# Patient Record
Sex: Female | Born: 1990 | Race: Black or African American | Hispanic: No | Marital: Single | Smoking: Former smoker
Health system: Southern US, Community
[De-identification: ages and names within clinical notes are randomized; demographics above are authoritative.]

## PROBLEM LIST (undated history)

## (undated) DIAGNOSIS — M419 Scoliosis, unspecified: Secondary | ICD-10-CM

## (undated) DIAGNOSIS — D649 Anemia, unspecified: Secondary | ICD-10-CM

## (undated) DIAGNOSIS — J45909 Unspecified asthma, uncomplicated: Secondary | ICD-10-CM

## (undated) HISTORY — DX: Anemia, unspecified: D64.9

---

## 2017-08-21 ENCOUNTER — Emergency Department (HOSPITAL_COMMUNITY)
Admission: EM | Admit: 2017-08-21 | Discharge: 2017-08-21 | Disposition: A | Discharge status: Home / Self Care | Payer: 59 | Attending: Emergency Medicine | Admitting: Emergency Medicine

## 2017-08-21 ENCOUNTER — Encounter (HOSPITAL_COMMUNITY): Payer: Self-pay | Admitting: *Deleted

## 2017-08-21 ENCOUNTER — Other Ambulatory Visit: Payer: Self-pay

## 2017-08-21 DIAGNOSIS — J45909 Unspecified asthma, uncomplicated: Secondary | ICD-10-CM | POA: Insufficient documentation

## 2017-08-21 DIAGNOSIS — Y999 Unspecified external cause status: Secondary | ICD-10-CM | POA: Diagnosis not present

## 2017-08-21 DIAGNOSIS — Y92410 Unspecified street and highway as the place of occurrence of the external cause: Secondary | ICD-10-CM | POA: Insufficient documentation

## 2017-08-21 DIAGNOSIS — Y939 Activity, unspecified: Secondary | ICD-10-CM | POA: Diagnosis not present

## 2017-08-21 DIAGNOSIS — M545 Low back pain: Secondary | ICD-10-CM | POA: Insufficient documentation

## 2017-08-21 DIAGNOSIS — Z87891 Personal history of nicotine dependence: Secondary | ICD-10-CM | POA: Insufficient documentation

## 2017-08-21 DIAGNOSIS — M542 Cervicalgia: Secondary | ICD-10-CM | POA: Insufficient documentation

## 2017-08-21 HISTORY — DX: Unspecified asthma, uncomplicated: J45.909

## 2017-08-21 HISTORY — DX: Scoliosis, unspecified: M41.9

## 2017-08-21 MED ORDER — CYCLOBENZAPRINE HCL 10 MG PO TABS: 10.0000 mg | ORAL_TABLET | Freq: Two times a day (BID) | ORAL | 0 refills | Status: DC | PRN

## 2017-08-21 MED ORDER — IBUPROFEN 600 MG PO TABS: 600.0000 mg | ORAL_TABLET | Freq: Four times a day (QID) | ORAL | 0 refills | Status: DC | PRN
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
## 2018-06-14 DIAGNOSIS — G43001 Migraine without aura, not intractable, with status migrainosus: Secondary | ICD-10-CM | POA: Diagnosis not present

## 2018-09-30 DIAGNOSIS — J111 Influenza due to unidentified influenza virus with other respiratory manifestations: Secondary | ICD-10-CM | POA: Diagnosis not present

## 2019-02-04 DIAGNOSIS — R238 Other skin changes: Secondary | ICD-10-CM | POA: Diagnosis not present

## 2019-02-04 DIAGNOSIS — H6001 Abscess of right external ear: Secondary | ICD-10-CM | POA: Diagnosis not present

## 2019-07-09 ENCOUNTER — Encounter: Payer: Self-pay | Admitting: Emergency Medicine

## 2019-07-09 ENCOUNTER — Ambulatory Visit
Admission: EM | Admit: 2019-07-09 | Discharge: 2019-07-09 | Disposition: A | Discharge status: Home / Self Care | Payer: Medicaid Other | Attending: Physician Assistant | Admitting: Physician Assistant

## 2019-07-09 ENCOUNTER — Other Ambulatory Visit: Payer: Self-pay

## 2019-07-09 DIAGNOSIS — Z3201 Encounter for pregnancy test, result positive: Secondary | ICD-10-CM | POA: Diagnosis not present

## 2019-07-09 DIAGNOSIS — R0981 Nasal congestion: Secondary | ICD-10-CM | POA: Diagnosis not present

## 2019-07-09 DIAGNOSIS — R05 Cough: Secondary | ICD-10-CM | POA: Diagnosis not present

## 2019-07-09 DIAGNOSIS — O219 Vomiting of pregnancy, unspecified: Secondary | ICD-10-CM

## 2019-07-09 DIAGNOSIS — Z1159 Encounter for screening for other viral diseases: Secondary | ICD-10-CM

## 2019-07-09 DIAGNOSIS — R059 Cough, unspecified: Secondary | ICD-10-CM

## 2019-07-09 LAB — POCT URINE PREGNANCY: Preg Test, Ur: POSITIVE — AB

## 2019-07-09 MED ORDER — DOXYLAMINE-PYRIDOXINE 10-10 MG PO TBEC: 1.0000 | DELAYED_RELEASE_TABLET | Freq: Two times a day (BID) | ORAL | 0 refills | Status: DC | PRN

## 2019-07-09 MED ORDER — COMPLETENATE 29-1 MG PO CHEW: 1.0000 | CHEWABLE_TABLET | Freq: Every day | ORAL | 0 refills | Status: DC

## 2019-07-09 NOTE — ED Triage Notes (Signed)
PT reports cough, headache, nausea, and vomiting for 1 week. PT also reports she is 9 days late for menstrual and needs a pregnancy test.

## 2019-07-09 NOTE — ED Provider Notes (Signed)
[facility]    CSN: [id number] Arrival date & time: 07/09/19  1207      History   Chief Complaint Chief Complaint  Patient presents with  . Nausea  . Headache    HPI Loretta Schmitt is a 28 y.o. female.   28 year old female comes in for 1 week history of nausea, vomiting, cough, nasal congestion. Denies fever, chills, body aches. Denies abdominal pain, diarrhea. Denies shortness of breath, loss of taste/smell. No obvious sick/COVID contact.   She was like pregnancy test as she is 9 days late for her cycle. Had spotting 1 week ago that has since resolved.      Past Medical History:  Diagnosis Date  . Asthma   . Scoliosis     There are no active problems to display for this patient.   Past Surgical History:  Procedure Laterality Date  . CESAREAN SECTION      OB History   No obstetric history on file.      Home Medications    Prior to Admission medications   Medication Sig Start Date End Date Taking? Authorizing Provider  Doxylamine-Pyridoxine 10-10 MG TBEC Take 1 tablet by mouth 2 (two) times daily as needed. 07/09/19   Tasia Catchings,  V, PA-C  prenatal vitamin w/FE, FA (NATACHEW) 29-1 MG CHEW chewable tablet Chew 1 tablet by mouth daily at 12 noon. 07/09/19   Ok Edwards, PA-C    Family History No family history on file.  Social History Social History   Tobacco Use  . Smoking status: Former Smoker    Quit date: 10/06/2012    Years since quitting: 6.7  . Smokeless tobacco: Never Used  Substance Use Topics  . Alcohol use: No    Frequency: Never  . Drug use: No     Allergies   Patient has no known allergies.   Review of Systems Review of Systems  Reason unable to perform ROS: See HPI as above.     Physical Exam Triage Vital Signs ED Triage Vitals  Enc Vitals Group     BP 07/09/19 1225 114/76     Pulse Rate 07/09/19 1225 83     Resp 07/09/19 1225 16     Temp 07/09/19 1225 98.3 F (36.8 C)     Temp Source 07/09/19 1225 Oral     SpO2  07/09/19 1225 98 %     Weight --      Height --      Head Circumference --      Peak Flow --      Pain Score 07/09/19 1221 2     Pain Loc --      Pain Edu? --      Excl. in [place]? --    No data found.  Updated Vital Signs BP 114/76   Pulse 83   Temp 98.3 F (36.8 C) (Oral)   Resp 16   LMP 06/01/2019   SpO2 98%   Physical Exam Constitutional:      General: She is not in acute distress.    Appearance: Normal appearance. She is not ill-appearing, toxic-appearing or diaphoretic.  HENT:     Head: Normocephalic and atraumatic.     Nose:     Right Sinus: No maxillary sinus tenderness or frontal sinus tenderness.     Left Sinus: No maxillary sinus tenderness or frontal sinus tenderness.     Mouth/Throat:     Mouth: Mucous membranes are moist.     Pharynx:  Oropharynx is clear. Uvula midline.  Neck:     Musculoskeletal: Normal range of motion and neck supple.  Cardiovascular:     Rate and Rhythm: Normal rate and regular rhythm.     Heart sounds: Normal heart sounds. No murmur. No friction rub. No gallop.   Pulmonary:     Effort: Pulmonary effort is normal. No accessory muscle usage, prolonged expiration, respiratory distress or retractions.     Comments: Lungs clear to auscultation without adventitious lung sounds. Neurological:     General: No focal deficit present.     Mental Status: She is alert and oriented to person, place, and time.      UC Treatments / Results  Labs (all labs ordered are listed, but only abnormal results are displayed) Labs Reviewed  POCT URINE PREGNANCY - Abnormal; Notable for the following components:      Result Value   Preg Test, Ur Positive (*)    All other components within normal limits  NOVEL CORONAVIRUS, NAA    EKG   Radiology No results found.  Procedures Procedures (including critical care time)  Medications Ordered in UC Medications - No data to display  Initial Impression / Assessment and Plan / UC Course  I have reviewed  the triage vital signs and the nursing notes.  Pertinent labs & imaging results that were available during my care of the patient were reviewed by me and considered in my medical decision making (see chart for details).     Urine pregnancy positive. Patient without abdominal pain, vaginal bleeding. Given congestion and cough, will send for COVID testing. Will provide diclegis for nausea/vomiting and start prenatal vitamins. Follow up with OB for further evaluation and management needed. Return precautions given. Patient expresses understanding and agrees to plan.  Final Clinical Impressions(s) / UC Diagnoses   Final diagnoses:  Positive urine pregnancy test  Nausea/vomiting in pregnancy  Nasal congestion  Cough    ED Prescriptions    Medication Sig Dispense Auth. Provider   prenatal vitamin w/FE, FA (NATACHEW) 29-1 MG CHEW chewable tablet Chew 1 tablet by mouth daily at 12 noon. 30 tablet ,  V, PA-C   Doxylamine-Pyridoxine 10-10 MG TBEC Take 1 tablet by mouth 2 (two) times daily as needed. 30 tablet Belinda Fisher, PA-C     PDMP not reviewed this encounter.   Belinda Fisher, PA-C 07/09/19 1310

## 2019-07-09 NOTE — Discharge Instructions (Signed)
Urine pregnancy positive. Start prenatal vitamins. Start diclegis for nausea/vomiting. Can switch to over the counter version if diclegis is not covered under insurance. Follow up with OB for further evaluation and management needed. Given you have some cold symptoms, will test for COVID as well. Please quarantine until results return. If noticing shortness of breath, chest pain, vaginal bleeding, abdominal pain, go to the ED for further evaluation needed.

## 2019-07-10 LAB — NOVEL CORONAVIRUS, NAA: SARS-CoV-2, NAA: NOT DETECTED

## 2019-07-25 ENCOUNTER — Inpatient Hospital Stay (HOSPITAL_COMMUNITY)
Admission: AD | Admit: 2019-07-25 | Discharge: 2019-07-25 | Disposition: A | Discharge status: Home / Self Care | Payer: Managed Care, Other (non HMO) | Attending: Family Medicine | Admitting: Family Medicine

## 2019-07-25 ENCOUNTER — Encounter (HOSPITAL_COMMUNITY): Payer: Self-pay | Admitting: *Deleted

## 2019-07-25 ENCOUNTER — Other Ambulatory Visit: Payer: Self-pay

## 2019-07-25 DIAGNOSIS — Z3A01 Less than 8 weeks gestation of pregnancy: Secondary | ICD-10-CM | POA: Insufficient documentation

## 2019-07-25 DIAGNOSIS — O210 Mild hyperemesis gravidarum: Secondary | ICD-10-CM | POA: Diagnosis not present

## 2019-07-25 DIAGNOSIS — Z87891 Personal history of nicotine dependence: Secondary | ICD-10-CM | POA: Insufficient documentation

## 2019-07-25 DIAGNOSIS — O99511 Diseases of the respiratory system complicating pregnancy, first trimester: Secondary | ICD-10-CM | POA: Diagnosis not present

## 2019-07-25 DIAGNOSIS — J45909 Unspecified asthma, uncomplicated: Secondary | ICD-10-CM | POA: Diagnosis not present

## 2019-07-25 DIAGNOSIS — M419 Scoliosis, unspecified: Secondary | ICD-10-CM | POA: Insufficient documentation

## 2019-07-25 LAB — COMPREHENSIVE METABOLIC PANEL
ALT: 19 U/L (ref 0–44)
AST: 16 U/L (ref 15–41)
Albumin: 3.9 g/dL (ref 3.5–5.0)
Alkaline Phosphatase: 29 U/L — ABNORMAL LOW (ref 38–126)
Anion gap: 11 (ref 5–15)
BUN: 5 mg/dL — ABNORMAL LOW (ref 6–20)
CO2: 24 mmol/L (ref 22–32)
Calcium: 9.1 mg/dL (ref 8.9–10.3)
Chloride: 100 mmol/L (ref 98–111)
Creatinine, Ser: 0.52 mg/dL (ref 0.44–1.00)
GFR calc Af Amer: >60 mL/min (ref >60)
GFR calc non Af Amer: >60 mL/min (ref >60)
Glucose, Bld: 87 mg/dL (ref 70–99)
Potassium: 3.4 mmol/L — ABNORMAL LOW (ref 3.5–5.1)
Sodium: 135 mmol/L (ref 135–145)
Total Bilirubin: 0.9 mg/dL (ref 0.3–1.2)
Total Protein: 6.8 g/dL (ref 6.5–8.1)

## 2019-07-25 LAB — URINALYSIS, ROUTINE W REFLEX MICROSCOPIC
Bacteria, UA: NONE SEEN
Bilirubin Urine: NEGATIVE
Glucose, UA: NEGATIVE mg/dL
Hgb urine dipstick: NEGATIVE
Ketones, ur: 20 mg/dL — AB
Leukocytes,Ua: NEGATIVE
Nitrite: NEGATIVE
Protein, ur: >=300 mg/dL — AB
Specific Gravity, Urine: 1.028 (ref 1.005–1.030)
pH: 6 (ref 5.0–8.0)

## 2019-07-25 MED ORDER — M.V.I. ADULT IV INJ
Freq: Once | INTRAVENOUS | Status: AC
  Administered 2019-07-25: 18:00:00 via INTRAVENOUS
  Filled 2019-07-25: qty 1000

## 2019-07-25 MED ORDER — SCOPOLAMINE 1 MG/3DAYS TD PT72
1.0000 | MEDICATED_PATCH | Freq: Once | TRANSDERMAL | Status: DC
  Administered 2019-07-25: 1.5 mg via TRANSDERMAL
  Filled 2019-07-25: qty 1

## 2019-07-25 MED ORDER — SODIUM CHLORIDE 0.9 % IV SOLN
25.0000 mg | Freq: Once | INTRAVENOUS | Status: AC
  Administered 2019-07-25: 25 mg via INTRAVENOUS
  Filled 2019-07-25: qty 1

## 2019-07-25 NOTE — H&P (Signed)
Chief Complaint: Emesis   First Provider Initiated Contact with Patient 07/25/19 1557     SUBJECTIVE HPI: Loretta FurbishSharonda Schmitt is a 28 y.o. G2P0101 at 2213w5d who presents to Maternity Admissions reporting 2 weeks of progressively worsening vomiting. The pt states she can no longer hold down fluids including water. Patient has been taking doxylamine-pyridoxine for nausea and vomiting, however, she does not take this during the day because it is too sedating and she can't function in her employment. The patient reports that she is taking the medication at night as it helps her sleep but wakes up each morning around 5 or 6AM to immediately vomit. She also reports heavy sweats during many but not all vomiting episodes Patient denies pre/syncope, H/A, dysphagia, aspiration, no blood in vomit, urine or stool. Patient denies diarrhea/constipation. She is taking her prenatal vitamin without issues. Patient has visit scheduled with [facility] this week for first OB appointment.      Vomitting about ten times a day...given diclegis but makes her tired so cant take during the day, so only at night...helps her sleep She wakes at 5-6 AM to vomit Gets very sweaty while vomiting  Hx anemia  Past Medical History:  Diagnosis Date  . Asthma   . Scoliosis    OB History  Gravida Para Term Preterm AB Living  2 1   1   1   SAB TAB Ectopic Multiple Live Births               # Outcome Date GA Lbr Len/2nd Weight Sex Delivery Anes PTL Lv  2 Current           1 Preterm      CS-Unspec      Past Surgical History:  Procedure Laterality Date  . CESAREAN SECTION     Social History   Socioeconomic History  . Marital status: Single    Spouse name: Not on file  . Number of children: Not on file  . Years of education: Not on file  . Highest education level: Not on file  Occupational History  . Not on file  Social Needs  . Financial resource strain: Not on file  . Food insecurity    Worry: Not on file     Inability: Not on file  . Transportation needs    Medical: Not on file    Non-medical: Not on file  Tobacco Use  . Smoking status: Former Smoker    Quit date: 10/06/2012    Years since quitting: 6.8  . Smokeless tobacco: Never Used  Substance and Sexual Activity  . Alcohol use: No    Frequency: Never  . Drug use: No  . Sexual activity: Yes  Lifestyle  . Physical activity    Days per week: Not on file    Minutes per session: Not on file  . Stress: Not on file  Relationships  . Social Musicianconnections    Talks on phone: Not on file    Gets together: Not on file    Attends religious service: Not on file    Active member of club or organization: Not on file    Attends meetings of clubs or organizations: Not on file    Relationship status: Not on file  . Intimate partner violence    Fear of current or ex partner: Not on file    Emotionally abused: Not on file    Physically abused: Not on file    Forced sexual activity: Not on file  Other Topics Concern  . Not on file  Social History Narrative  . Not on file   History reviewed. No pertinent family history. No current facility-administered medications on file prior to encounter.    Current Outpatient Medications on File Prior to Encounter  Medication Sig Dispense Refill  . Doxylamine-Pyridoxine 10-10 MG TBEC Take 1 tablet by mouth 2 (two) times daily as needed. 30 tablet 0  . prenatal vitamin w/FE, FA (NATACHEW) 29-1 MG CHEW chewable tablet Chew 1 tablet by mouth daily at 12 noon. 30 tablet 0   No Known Allergies  I have reviewed patient's Past Medical Hx, Surgical Hx, Family Hx, Social Hx, medications and allergies.   Review of Systems  Constitutional: Positive for appetite change.  HENT: Negative.   Eyes: Negative.   Respiratory: Negative.   Gastrointestinal: Positive for nausea and vomiting.  Endocrine: Negative.   Genitourinary: Negative.   Musculoskeletal: Negative.   Skin: Negative.   Allergic/Immunologic: Negative.    Neurological: Negative.   Hematological: Negative.   Psychiatric/Behavioral: Negative.     OBJECTIVE Patient Vitals for the past 24 hrs:  BP Temp Temp src Pulse Resp SpO2 Weight  07/25/19 1545 136/61 98.5 F (36.9 C) Oral 67 18 100 % 79.8 kg   Constitutional: Well-developed, well-nourished female in no acute distress.  Cardiovascular: normal rate & rhythm, no murmur Respiratory: normal rate and effort. Lung sounds clear throughout GI: Abd soft, non-tender, Pos BS x 4. No guarding or rebound tenderness MS: Extremities nontender, no edema, normal ROM Neurologic: Alert and oriented x 4.  GU: Deferred   LAB RESULTS Results for orders placed or performed during the hospital encounter of 07/25/19 (from the past 24 hour(s))  Urinalysis, Routine w reflex microscopic     Status: Abnormal   Collection Time: 07/25/19  3:51 PM  Result Value Ref Range   Color, Urine AMBER (A) YELLOW   APPearance HAZY (A) CLEAR   Specific Gravity, Urine 1.028 1.005 - 1.030   pH 6.0 5.0 - 8.0   Glucose, UA NEGATIVE NEGATIVE mg/dL   Hgb urine dipstick NEGATIVE NEGATIVE   Bilirubin Urine NEGATIVE NEGATIVE   Ketones, ur 20 (A) NEGATIVE mg/dL   Protein, ur >=300 (A) NEGATIVE mg/dL   Nitrite NEGATIVE NEGATIVE   Leukocytes,Ua NEGATIVE NEGATIVE   RBC / HPF 0-5 0 - 5 RBC/hpf   WBC, UA 0-5 0 - 5 WBC/hpf   Bacteria, UA NONE SEEN NONE SEEN   Squamous Epithelial / LPF 0-5 0 - 5   Mucus PRESENT   Comprehensive metabolic panel Once     Status: Abnormal   Collection Time: 07/25/19  5:27 PM  Result Value Ref Range   Sodium 135 135 - 145 mmol/L   Potassium 3.4 (L) 3.5 - 5.1 mmol/L   Chloride 100 98 - 111 mmol/L   CO2 24 22 - 32 mmol/L   Glucose, Bld 87 70 - 99 mg/dL   BUN 5 (L) 6 - 20 mg/dL   Creatinine, Ser 0.52 0.44 - 1.00 mg/dL   Calcium 9.1 8.9 - 10.3 mg/dL   Total Protein 6.8 6.5 - 8.1 g/dL   Albumin 3.9 3.5 - 5.0 g/dL   AST 16 15 - 41 U/L   ALT 19 0 - 44 U/L   Alkaline Phosphatase 29 (L) 38 - 126 U/L    Total Bilirubin 0.9 0.3 - 1.2 mg/dL   GFR calc non Af Amer >60 >60 mL/min   GFR calc Af Amer >60 >60 mL/min   Anion  gap 11 5 - 15    MAU COURSE Orders Placed This Encounter  Procedures  . Urinalysis, Routine w reflex microscopic  . Comprehensive metabolic panel Once  . Discharge patient   Meds ordered this encounter  Medications  . promethazine (PHENERGAN) 25 mg in sodium chloride 0.9 % 1,000 mL infusion  . lactated ringers 1,000 mL with multivitamins adult (INFUVITE ADULT) 10 mL infusion  . scopolamine (TRANSDERM-SCOP) 1 MG/3DAYS 1.5 mg    MDM  ASSESSMENT 1. Morning sickness    PLAN Discharge home in stable condition. Information provided on Morning sickness and eating plan for pregnant women Discussed with patient eating small snacks/meals every 2-3 hours and to have a protein and complex carbohydrate before bed. Given Scopolamine patch prior to d/c home   Follow-up Information    [facility], [place]. Go on 07/27/2019.   Why: As scheduled Contact information: [address] [place] [address] [phone]          Allergies as of 07/25/2019   No Known Allergies     Medication List    TAKE these medications   Doxylamine-Pyridoxine 10-10 MG Tbec Take 1 tablet by mouth 2 (two) times daily as needed.   prenatal vitamin w/FE, FA 29-1 MG Chew chewable tablet Chew 1 tablet by mouth daily at 12 noon.        Vanessa [place] Medical Student 07/25/2019    I confirm that I have verified the information documented in the medical student's note and that I have also personally reperformed the history, physical exam and all medical decision making activities of this service and have verified that all service and findings are accurately documented in this student's note.   Note had to be copied from Enis Gash, MS-3 d/t not being sent to co-signer in proper way.  Raelyn Mora, CNM 07/25/2019 8:40 PM

## 2019-07-25 NOTE — MAU Note (Signed)
Pt presents to MAU with c/o vomiting that started x 2 weeks ago. She cannot keep down any solids or liquids. Pt denies pain, has visit scheduled with [facility] this week for first OB appointment.

## 2019-07-29 ENCOUNTER — Other Ambulatory Visit: Payer: Self-pay | Admitting: Obstetrics and Gynecology

## 2019-07-29 DIAGNOSIS — J4520 Mild intermittent asthma, uncomplicated: Secondary | ICD-10-CM

## 2019-07-29 DIAGNOSIS — O210 Mild hyperemesis gravidarum: Secondary | ICD-10-CM

## 2019-07-29 MED ORDER — SCOPOLAMINE 1 MG/3DAYS TD PT72: 1.0000 | MEDICATED_PATCH | TRANSDERMAL | 0 refills | Status: DC

## 2019-07-29 MED ORDER — ALBUTEROL SULFATE HFA 108 (90 BASE) MCG/ACT IN AERS: 2.0000 | INHALATION_SPRAY | Freq: Four times a day (QID) | RESPIRATORY_TRACT | 0 refills | Status: AC | PRN

## 2019-07-29 NOTE — Progress Notes (Signed)
Patient called to request for Rx for Scopolamine patch. TC to patient to notify of Rx being sent. Apologies given for not sending Rx on the day of her d/c from MAU. Patient also requesting Rx for Albuterol inhaler. Rx for scopolamine and Albuterol sent. Patient advised to f/u with her OB provider [facility] for further [facility] and asthmatic complaints.  Laury Deep, CNM  07/29/2019 3:26 PM

## 2019-08-02 LAB — OB RESULTS CONSOLE RUBELLA ANTIBODY, IGM: Rubella: IMMUNE

## 2019-08-02 LAB — OB RESULTS CONSOLE ABO/RH: RH Type: POSITIVE

## 2019-08-02 LAB — OB RESULTS CONSOLE HGB/HCT, BLOOD
HCT: 36 (ref 29–41)
Hemoglobin: 12

## 2019-08-02 LAB — OB RESULTS CONSOLE HIV ANTIBODY (ROUTINE TESTING): HIV: NONREACTIVE

## 2019-08-02 LAB — OB RESULTS CONSOLE PLATELET COUNT: Platelets: 383

## 2019-08-02 LAB — OB RESULTS CONSOLE ANTIBODY SCREEN: Antibody Screen: NEGATIVE

## 2019-08-02 LAB — OB RESULTS CONSOLE HEPATITIS B SURFACE ANTIGEN: Hepatitis B Surface Ag: NEGATIVE

## 2019-08-02 NOTE — H&P (Signed)
Loretta Schmitt is a 28 y.o. female presenting for cerclage. She has a hx of 26 wga PTB. She had a shortened cervix during that pregnancy and required an emergency CS via midline vertical.  OB History    Gravida  2   Para  1   Term      Preterm  1   AB      Living  1     SAB      TAB      Ectopic      Multiple      Live Births             Past Medical History:  Diagnosis Date  . Asthma   . Scoliosis    Past Surgical History:  Procedure Laterality Date  . CESAREAN SECTION     Family History: family history is not on file. Social History:  reports that she quit smoking about 6 years ago. She has never used smokeless tobacco. She reports that she does not drink alcohol or use drugs.     Maternal Diabetes: No Genetic Screening: pending Maternal Ultrasounds/Referrals: Normal Fetal Ultrasounds or other Referrals:  None Maternal Substance Abuse:  No Significant Maternal Medications:  None Significant Maternal Lab Results:  None Other Comments:  None  ROS History   Last menstrual period 06/01/2019. Exam Physical Exam  Gen: well appearing, NAD CV: Reg rate Pulm: NWOB Abd: soft, nondistended, nontender, no masses GYN: uterus appropriately sized, no adnexa ttp/CMT Ext: No edema b/l  Prenatal labs: ABO, Rh:   Antibody:   Rubella:   RPR:    HBsAg:    HIV:    GBS:      Assessment/Plan: 28 yo [id number] presenting for cervical cerclage s/s history indicated (shortened cervix with PTB at 26 wga. Viability was confirmed at last office visit. G/C was done at [facility] visit and is still pending. She has no absolute contraindications for a cerclage.  Risks of cerclage were discussed including chance of Infection, bleeding, ROM, suture migration, cervical dystocia/trauma, and failure. All questions answered and consent was given. No abx is indicated.   She is RH unknown and pending at this time. Will give rhogam if RH negative and FOB not negative.  Proceed with  cerclage.     Colin Benton Ivis Henneman 08/02/2019, 2:16 PM

## 2019-08-25 ENCOUNTER — Encounter (HOSPITAL_COMMUNITY): Payer: Self-pay

## 2019-08-25 ENCOUNTER — Ambulatory Visit (HOSPITAL_COMMUNITY): Admit: 2019-08-25 | Payer: Managed Care, Other (non HMO) | Admitting: Obstetrics and Gynecology

## 2019-08-25 SURGERY — CERCLAGE, CERVIX, VAGINAL APPROACH
Anesthesia: Choice

## 2019-09-04 ENCOUNTER — Other Ambulatory Visit: Payer: Self-pay | Admitting: Obstetrics and Gynecology

## 2019-09-04 DIAGNOSIS — O210 Mild hyperemesis gravidarum: Secondary | ICD-10-CM

## 2019-09-12 ENCOUNTER — Encounter: Payer: Self-pay | Admitting: Obstetrics

## 2019-09-12 ENCOUNTER — Other Ambulatory Visit: Payer: Self-pay

## 2019-09-12 ENCOUNTER — Ambulatory Visit (INDEPENDENT_AMBULATORY_CARE_PROVIDER_SITE_OTHER): Payer: Managed Care, Other (non HMO) | Admitting: Obstetrics and Gynecology

## 2019-09-12 VITALS — BP 107/67 | HR 80 | Wt 191.0 lb

## 2019-09-12 DIAGNOSIS — O0992 Supervision of high risk pregnancy, unspecified, second trimester: Secondary | ICD-10-CM

## 2019-09-12 DIAGNOSIS — Z8751 Personal history of pre-term labor: Secondary | ICD-10-CM

## 2019-09-12 DIAGNOSIS — Z3481 Encounter for supervision of other normal pregnancy, first trimester: Secondary | ICD-10-CM | POA: Diagnosis not present

## 2019-09-12 DIAGNOSIS — O210 Mild hyperemesis gravidarum: Secondary | ICD-10-CM

## 2019-09-12 DIAGNOSIS — Z98891 History of uterine scar from previous surgery: Secondary | ICD-10-CM

## 2019-09-12 DIAGNOSIS — Z3A14 14 weeks gestation of pregnancy: Secondary | ICD-10-CM

## 2019-09-12 NOTE — Progress Notes (Signed)
INITIAL PRENATAL VISIT NOTE  Subjective:  Loretta Schmitt is a 28 y.o. [id number] at [redacted]w[redacted]d by LMP being seen today for her initial prenatal visit. She has an obstetric history significant for CS @ 26 weeks for PPROM with "foot in vagina" after having found to have had shortened cervix with admission at 25 weeks on routine anatomy. She has a medical history significant for n/a. Denies other issues.  Patient reports no complaints.  Contractions: Not present. Vag. Bleeding: None.  Movement: Present. Denies leaking of fluid.    Past Medical History:  Diagnosis Date  . Anemia   . Asthma   . Scoliosis     Past Surgical History:  Procedure Laterality Date  . CESAREAN SECTION      OB History  Gravida Para Term Preterm AB Living  3 1   1 1 1   SAB TAB Ectopic Multiple Live Births    1     1    # Outcome Date GA Lbr Len/2nd Weight Sex Delivery Anes PTL Lv  3 Current           2 TAB 01/05/15          1 Preterm 02/26/14 [redacted]w[redacted]d   M CS-LTranv   LIV    Social History   Socioeconomic History  . Marital status: Single    Spouse name: Not on file  . Number of children: Not on file  . Years of education: Not on file  . Highest education level: Not on file  Occupational History  . Not on file  Social Needs  . Financial resource strain: Not on file  . Food insecurity    Worry: Not on file    Inability: Not on file  . Transportation needs    Medical: Not on file    Non-medical: Not on file  Tobacco Use  . Smoking status: Former Smoker    Quit date: 10/06/2012    Years since quitting: 6.9  . Smokeless tobacco: Never Used  Substance and Sexual Activity  . Alcohol use: No    Frequency: Never  . Drug use: No  . Sexual activity: Yes  Lifestyle  . Physical activity    Days per week: Not on file    Minutes per session: Not on file  . Stress: Not on file  Relationships  . Social 12/04/2012 on phone: Not on file    Gets together: Not on file    Attends religious service:  Not on file    Active member of club or organization: Not on file    Attends meetings of clubs or organizations: Not on file    Relationship status: Not on file  Other Topics Concern  . Not on file  Social History Narrative  . Not on file    History reviewed. No pertinent family history.   Current Outpatient Medications:  .  albuterol (VENTOLIN HFA) 108 (90 Base) MCG/ACT inhaler, Inhale 2 puffs into the lungs every 6 (six) hours as needed for wheezing or shortness of breath., Disp: 6.7 g, Rfl: 0 .  Doxylamine-Pyridoxine 10-10 MG TBEC, Take 1 tablet by mouth 2 (two) times daily as needed., Disp: 30 tablet, Rfl: 0 .  prenatal vitamin w/FE, FA (NATACHEW) 29-1 MG CHEW chewable tablet, Chew 1 tablet by mouth daily at 12 noon., Disp: 30 tablet, Rfl: 0 .  scopolamine (TRANSDERM-SCOP) 1 MG/3DAYS, PLACE 1 PATCH ONTO THE SKIN EVERY 3 DAYS, Disp: 4 patch, Rfl: 0  No Known  Allergies  Review of Systems: Negative except for what is mentioned in HPI.  Objective:   Vitals:   09/12/19 1438  BP: 107/67  Pulse: 80  Weight: 191 lb (86.6 kg)    Fetal Status:     Movement: Present     Physical Exam: BP 107/67   Pulse 80   Wt 191 lb (86.6 kg)   LMP 06/01/2019   BMI 33.83 kg/m  CONSTITUTIONAL: Well-developed, well-nourished female in no acute distress.  NEUROLOGIC: Alert and oriented to person, place, and time. Normal reflexes, muscle tone coordination. No cranial nerve deficit noted. PSYCHIATRIC: Normal mood and affect. Normal behavior. Normal judgment and thought content. SKIN: Skin is warm and dry. No rash noted. Not diaphoretic. No erythema. No pallor. HENT:  Normocephalic, atraumatic, External right and left ear normal. Oropharynx is clear and moist EYES: Conjunctivae and EOM are normal. Pupils are equal, round, and reactive to light. No scleral icterus.  NECK: Normal range of motion, supple, no masses CARDIOVASCULAR: Normal heart rate noted, regular rhythm RESPIRATORY: Effort and  breath sounds normal, no problems with respiration noted BREASTS: deferred ABDOMEN: Soft, nontender, nondistended, gravid. GU: deferred MUSCULOSKELETAL: Normal range of motion. EXT:  No edema and no tenderness. 2+ distal pulses.   Assessment and Plan:  Pregnancy: G3P0111 at [redacted]w[redacted]d by LMP  1. Morning sickness  2. Supervision of high risk pregnancy in second trimester Transfer from [facility] - AMB referral to maternal fetal medicine - [place] [facility] OB Transvaginal; Future - will have records from [facility] put into Epic  3. H/O: C-section 26 weeks for foot in vagina - need operative records  4. History of preterm delivery - no records available but patient is relatively good historian and relates the below - Pt reports incidental shortened cervix found on anatomy US at 25 weeks, she was immediately admitted to hospital for same, then left AMA, returned around 26 weeks. Then had intercourse while in hospital, broke her water and fetal foot was found in the vagina so she was taken for emergency c-section - reviewed cerclage versus 17P for shortened cervix and PPROM, that its not clear that she had pre-term cervical dilation or cervical incompetence and she may be better candidate for 17P, reviewed risks/benefits of both - will have TVUS done at [facility]FM and get their opinion regarding optimal management - she verbalizes understanding of this and is in agreemetn of plan   Preterm labor symptoms and general obstetric precautions including but not limited to vaginal bleeding, contractions, leaking of fluid and fetal movement were reviewed in detail with the patient.  Please refer to After Visit Summary for other counseling recommendations.   Return in about 2 weeks (around 09/26/2019) for high OB, in person.  Sloan Leiter 09/12/2019 5:05 PM

## 2019-09-13 ENCOUNTER — Ambulatory Visit (HOSPITAL_COMMUNITY): Payer: Managed Care, Other (non HMO) | Attending: Obstetrics and Gynecology

## 2019-09-13 ENCOUNTER — Inpatient Hospital Stay (HOSPITAL_COMMUNITY): Admission: RE | Admit: 2019-09-13 | Payer: Managed Care, Other (non HMO) | Source: Ambulatory Visit

## 2019-09-20 ENCOUNTER — Other Ambulatory Visit: Payer: Self-pay | Admitting: Obstetrics and Gynecology

## 2019-09-20 ENCOUNTER — Encounter: Payer: Self-pay | Admitting: Obstetrics and Gynecology

## 2019-09-20 DIAGNOSIS — O0990 Supervision of high risk pregnancy, unspecified, unspecified trimester: Secondary | ICD-10-CM | POA: Insufficient documentation

## 2019-09-20 DIAGNOSIS — O210 Mild hyperemesis gravidarum: Secondary | ICD-10-CM

## 2019-09-22 ENCOUNTER — Other Ambulatory Visit: Payer: Self-pay

## 2019-09-22 ENCOUNTER — Telehealth: Payer: Self-pay | Admitting: Emergency Medicine

## 2019-09-22 MED ORDER — DOXYLAMINE-PYRIDOXINE 10-10 MG PO TBEC: 1.0000 | DELAYED_RELEASE_TABLET | Freq: Two times a day (BID) | ORAL | 0 refills | Status: DC | PRN

## 2019-09-22 NOTE — Telephone Encounter (Signed)
Pt called wanting refill for nausea medication given in October.  This RN made her aware of our inability to refill a medication for a visit, and patient states she has been receiving refills from [place], and this RN explained she was not sure how or why it had happened previously.  This RN explained that we were unable to refill this medication at this time, and she would need to follow up with her OB/Gyn for further refills or come back in to be seen for her symptoms.  Patient verbalized understanding.

## 2019-09-27 ENCOUNTER — Encounter: Payer: Managed Care, Other (non HMO) | Admitting: Obstetrics and Gynecology

## 2019-10-05 ENCOUNTER — Other Ambulatory Visit: Payer: Self-pay

## 2019-10-05 ENCOUNTER — Ambulatory Visit (INDEPENDENT_AMBULATORY_CARE_PROVIDER_SITE_OTHER): Payer: Managed Care, Other (non HMO) | Admitting: Obstetrics & Gynecology

## 2019-10-05 VITALS — BP 123/77 | HR 96 | Wt 205.0 lb

## 2019-10-05 DIAGNOSIS — Z3A18 18 weeks gestation of pregnancy: Secondary | ICD-10-CM

## 2019-10-05 DIAGNOSIS — Z98891 History of uterine scar from previous surgery: Secondary | ICD-10-CM

## 2019-10-05 DIAGNOSIS — Z8751 Personal history of pre-term labor: Secondary | ICD-10-CM

## 2019-10-05 DIAGNOSIS — O0992 Supervision of high risk pregnancy, unspecified, second trimester: Secondary | ICD-10-CM

## 2019-10-05 MED ORDER — HYDROXYPROGESTERONE CAPROATE 275 MG/1.1ML ~~LOC~~ SOAJ
275.0000 mg | SUBCUTANEOUS | Status: DC
  Administered 2019-10-05: 275 mg via SUBCUTANEOUS

## 2019-10-05 MED ORDER — BLOOD PRESSURE KIT DEVI: 1.0000 | 0 refills | Status: AC | PRN

## 2019-10-05 NOTE — Progress Notes (Signed)
   PRENATAL VISIT NOTE  Subjective:  Loretta Schmitt is a 28 y.o. [phone] at 54w0dbeing seen today for ongoing prenatal care.  She is currently monitored for the following issues for this high-risk pregnancy and has Morning sickness; H/O: C-section; History of preterm delivery; and Supervision of high risk pregnancy in second trimester on their problem list.  Patient reports nausea.  Contractions: Not present. Vag. Bleeding: None.  Movement: Present. Denies leaking of fluid.   The following portions of the patient's history were reviewed and updated as appropriate: allergies, current medications, past family history, past medical history, past social history, past surgical history and problem list.   Objective:   Vitals:   10/05/19 1527  BP: 123/77  Pulse: 96  Weight: 205 lb (93 kg)    Fetal Status: Fetal Heart Rate (bpm): 163   Movement: Present     General:  Alert, oriented and cooperative. Patient is in no acute distress.  Skin: Skin is warm and dry. No rash noted.   Cardiovascular: Normal heart rate noted  Respiratory: Normal respiratory effort, no problems with respiration noted  Abdomen: Soft, gravid, appropriate for gestational age.  Pain/Pressure: Absent     Pelvic: Cervical exam deferred        Extremities: Normal range of motion.  Edema: None  Mental Status: Normal mood and affect. Normal behavior. Normal judgment and thought content.   Assessment and Plan:  Pregnancy: [id number] 143w0d. Supervision of high risk pregnancy in second trimester  - Blood Pressure Monitoring (BLOOD PRESSURE KIT) DEVI; 1 kit by Does not apply route as needed.  Dispense: 1 each; Refill: 0  2. History of preterm delivery Discussed with pt 17-OH-P Begin 17-OH-P  3. H/O: C-section  Preterm labor symptoms and general obstetric precautions including but not limited to vaginal bleeding, contractions, leaking of fluid and fetal movement were reviewed in detail with the patient. Please refer to  After Visit Summary for other counseling recommendations.   Return in about 4 weeks (around 11/02/2019) for in person.  No future appointments.  CaLavonia DraftsMD

## 2019-10-05 NOTE — Addendum Note (Signed)
Addended by: Tristan Schroeder D on: 10/05/2019 04:39 PM   Modules accepted: Orders

## 2019-10-05 NOTE — Progress Notes (Signed)
Patient reports fetal movement, denies pain. 

## 2019-10-05 NOTE — Progress Notes (Signed)
Administered 1st makena injection in L arm and pt tolerated well.

## 2019-10-07 LAB — AFP, SERUM, OPEN SPINA BIFIDA
AFP MoM: 0.87
AFP Value: 34.2 ng/mL
Gest. Age on Collection Date: 18 weeks
Maternal Age At EDD: 29.2 yr
OSBR Risk 1 IN: 10000
Test Results:: NEGATIVE
Weight: 205 [lb_av]

## 2019-10-11 ENCOUNTER — Telehealth: Payer: Self-pay | Admitting: Obstetrics

## 2019-10-12 ENCOUNTER — Other Ambulatory Visit: Payer: Self-pay | Admitting: Obstetrics and Gynecology

## 2019-10-12 DIAGNOSIS — O210 Mild hyperemesis gravidarum: Secondary | ICD-10-CM

## 2019-10-13 ENCOUNTER — Other Ambulatory Visit: Payer: Self-pay

## 2019-10-13 MED ORDER — DOXYLAMINE-PYRIDOXINE 10-10 MG PO TBEC: 2.0000 | DELAYED_RELEASE_TABLET | Freq: Every day | ORAL | 5 refills | Status: DC

## 2019-10-13 MED ORDER — DOXYLAMINE-PYRIDOXINE 10-10 MG PO TBEC: DELAYED_RELEASE_TABLET | ORAL | 5 refills | Status: DC

## 2019-10-13 NOTE — Addendum Note (Signed)
Addended by: Dalphine Handing on: 10/13/2019 02:22 PM   Modules accepted: Orders

## 2019-10-13 NOTE — Progress Notes (Signed)
PA denied Loretta Schmitt will not approve generic Doxylamine Pyridoxine. Pharmacy recommends to try Brand name to see if insurance will cover it

## 2019-10-25 ENCOUNTER — Other Ambulatory Visit: Payer: Self-pay | Admitting: Obstetrics

## 2019-10-25 DIAGNOSIS — O219 Vomiting of pregnancy, unspecified: Secondary | ICD-10-CM

## 2019-10-25 MED ORDER — DOXYLAMINE SUCCINATE (SLEEP) 25 MG PO TABS: 25.0000 mg | ORAL_TABLET | Freq: Two times a day (BID) | ORAL | 5 refills | Status: DC

## 2019-10-25 MED ORDER — VITAMIN B-6 50 MG PO TABS: 50.0000 mg | ORAL_TABLET | Freq: Two times a day (BID) | ORAL | 5 refills | Status: DC

## 2019-10-26 ENCOUNTER — Other Ambulatory Visit: Payer: Self-pay | Admitting: Obstetrics

## 2019-10-27 ENCOUNTER — Other Ambulatory Visit: Payer: Self-pay | Admitting: Obstetrics & Gynecology

## 2019-10-27 ENCOUNTER — Ambulatory Visit (HOSPITAL_COMMUNITY)
Admission: RE | Admit: 2019-10-27 | Discharge: 2019-10-27 | Disposition: A | Discharge status: Home / Self Care | Payer: Managed Care, Other (non HMO) | Source: Ambulatory Visit | Attending: Obstetrics & Gynecology | Admitting: Obstetrics & Gynecology

## 2019-10-27 ENCOUNTER — Other Ambulatory Visit (HOSPITAL_COMMUNITY): Payer: Self-pay | Admitting: *Deleted

## 2019-10-27 ENCOUNTER — Other Ambulatory Visit: Payer: Self-pay

## 2019-10-27 ENCOUNTER — Ambulatory Visit (HOSPITAL_COMMUNITY): Payer: Managed Care, Other (non HMO) | Admitting: *Deleted

## 2019-10-27 ENCOUNTER — Encounter (HOSPITAL_COMMUNITY): Payer: Self-pay

## 2019-10-27 DIAGNOSIS — Z8751 Personal history of pre-term labor: Secondary | ICD-10-CM | POA: Diagnosis present

## 2019-10-27 DIAGNOSIS — Z98891 History of uterine scar from previous surgery: Secondary | ICD-10-CM | POA: Diagnosis present

## 2019-10-27 DIAGNOSIS — Z3A21 21 weeks gestation of pregnancy: Secondary | ICD-10-CM | POA: Diagnosis not present

## 2019-10-27 DIAGNOSIS — O0992 Supervision of high risk pregnancy, unspecified, second trimester: Secondary | ICD-10-CM | POA: Diagnosis present

## 2019-10-27 DIAGNOSIS — O99212 Obesity complicating pregnancy, second trimester: Secondary | ICD-10-CM

## 2019-10-27 DIAGNOSIS — O210 Mild hyperemesis gravidarum: Secondary | ICD-10-CM | POA: Insufficient documentation

## 2019-10-27 DIAGNOSIS — O34219 Maternal care for unspecified type scar from previous cesarean delivery: Secondary | ICD-10-CM | POA: Diagnosis not present

## 2019-10-27 DIAGNOSIS — O09212 Supervision of pregnancy with history of pre-term labor, second trimester: Secondary | ICD-10-CM

## 2019-10-27 DIAGNOSIS — Z362 Encounter for other antenatal screening follow-up: Secondary | ICD-10-CM

## 2019-11-02 ENCOUNTER — Other Ambulatory Visit: Payer: Self-pay

## 2019-11-02 ENCOUNTER — Encounter: Payer: Self-pay | Admitting: Certified Nurse Midwife

## 2019-11-02 ENCOUNTER — Encounter: Payer: Self-pay | Admitting: Obstetrics and Gynecology

## 2019-11-02 ENCOUNTER — Ambulatory Visit (INDEPENDENT_AMBULATORY_CARE_PROVIDER_SITE_OTHER): Payer: Managed Care, Other (non HMO) | Admitting: Certified Nurse Midwife

## 2019-11-02 VITALS — BP 117/76 | HR 99 | Wt 217.0 lb

## 2019-11-02 DIAGNOSIS — Z98891 History of uterine scar from previous surgery: Secondary | ICD-10-CM

## 2019-11-02 DIAGNOSIS — Z8751 Personal history of pre-term labor: Secondary | ICD-10-CM

## 2019-11-02 DIAGNOSIS — O09212 Supervision of pregnancy with history of pre-term labor, second trimester: Secondary | ICD-10-CM

## 2019-11-02 DIAGNOSIS — O210 Mild hyperemesis gravidarum: Secondary | ICD-10-CM

## 2019-11-02 DIAGNOSIS — Z3A22 22 weeks gestation of pregnancy: Secondary | ICD-10-CM | POA: Diagnosis not present

## 2019-11-02 DIAGNOSIS — O0992 Supervision of high risk pregnancy, unspecified, second trimester: Secondary | ICD-10-CM

## 2019-11-02 MED ORDER — SCOPOLAMINE 1 MG/3DAYS TD PT72: 1.0000 | MEDICATED_PATCH | TRANSDERMAL | 1 refills | Status: DC

## 2019-11-02 MED ORDER — DICLEGIS 10-10 MG PO TBEC: 2.0000 | DELAYED_RELEASE_TABLET | Freq: Every day | ORAL | 2 refills | Status: DC

## 2019-11-02 MED ORDER — HYDROXYPROGESTERONE CAPROATE 275 MG/1.1ML ~~LOC~~ SOAJ
275.0000 mg | Freq: Once | SUBCUTANEOUS | Status: AC
  Administered 2019-11-02: 275 mg via SUBCUTANEOUS

## 2019-11-02 NOTE — Progress Notes (Signed)
Pt is here for ROB, [redacted]w[redacted]d. 

## 2019-11-02 NOTE — Progress Notes (Signed)
   PRENATAL VISIT NOTE  Subjective:  Loretta Schmitt is a 29 y.o. [phone] at [redacted]w[redacted]d being seen today for ongoing prenatal care.  She is currently monitored for the following issues for this high-risk pregnancy and has Morning sickness; H/O: C-section; History of preterm delivery; and Supervision of high risk pregnancy in second trimester on their problem list.  Patient reports no complaints.  Contractions: Not present. Vag. Bleeding: None.  Movement: Present. Denies leaking of fluid.   The following portions of the patient's history were reviewed and updated as appropriate: allergies, current medications, past family history, past medical history, past social history, past surgical history and problem list.   Objective:   Vitals:   11/02/19 1519  BP: 117/76  Pulse: 99  Weight: 217 lb (98.4 kg)    Fetal Status: Fetal Heart Rate (bpm): 150   Movement: Present     General:  Alert, oriented and cooperative. Patient is in no acute distress.  Skin: Skin is warm and dry. No rash noted.   Cardiovascular: Normal heart rate noted  Respiratory: Normal respiratory effort, no problems with respiration noted  Abdomen: Soft, gravid, appropriate for gestational age.  Pain/Pressure: Absent     Pelvic: Cervical exam deferred        Extremities: Normal range of motion.  Edema: Trace  Mental Status: Normal mood and affect. Normal behavior. Normal judgment and thought content.   Assessment and Plan:  Pregnancy: G3P0111 at [redacted]w[redacted]d 1. History of preterm delivery - patient has been injecting 17 P weekly at home  - Patient reports she is low on supply, pharmacy called by RN to ship her more injections  - HYDROXYprogesterone caproate (Makena) autoinjector 275 mg  2. Supervision of high risk pregnancy in second trimester - Patient doing well, no complaints - Denies abdominal pain, vaginal bleeding, discharge, or LOF  - Anticipatory guidance on upcoming appointments with GTT at next appointment, encouraged  patient to fast prior to appointment, patient verbalizes understanding   3. H/O: C-section - Hx of C/S, patient plans repeat C/S  4. Morning sickness - Patient request diclegis and scope patch refill, Rx sent to pharmacy of choice  - DICLEGIS 10-10 MG TBEC; Take 2 tablets by mouth at bedtime. Take 2 tablets at bedtime. If not helping can increase to 1 tablet in morning in addition to bedtime on day 3.  Dispense: 60 tablet; Refill: 2 - scopolamine (TRANSDERM-SCOP) 1 MG/3DAYS; Place 1 patch (1.5 mg total) onto the skin every 3 (three) days.  Dispense: 10 patch; Refill: 1  Preterm labor symptoms and general obstetric precautions including but not limited to vaginal bleeding, contractions, leaking of fluid and fetal movement were reviewed in detail with the patient. Please refer to After Visit Summary for other counseling recommendations.   Return in about 4 weeks (around 11/30/2019) for ROB/GTT - [facility] .  Future Appointments  Date Time Provider Department Center  11/24/2019  3:30 PM [facility] NURSE [facility]  11/24/2019  3:45 PM [facility] [place] 2 [facility]  11/30/2019  9:15 AM [facility] LAB [facility] None  11/30/2019  9:30 AM Constant, Gigi Gin, MD [facility] None    Sharyon Cable, CNM

## 2019-11-02 NOTE — Patient Instructions (Signed)
Glucose Tolerance Test During Pregnancy Why am I having this test? The glucose tolerance test (GTT) is done to check how your body processes sugar (glucose). This is one of several tests used to diagnose diabetes that develops during pregnancy (gestational diabetes mellitus). Gestational diabetes is a temporary form of diabetes that some women develop during pregnancy. It usually occurs during the second trimester of pregnancy and goes away after delivery. Testing (screening) for gestational diabetes usually occurs between 24 and 28 weeks of pregnancy. You may have the GTT test after having a 1-hour glucose screening test if the results from that test indicate that you may have gestational diabetes. You may also have this test if:  You have a history of gestational diabetes.  You have a history of giving birth to very large babies or have experienced repeated fetal loss (stillbirth).  You have signs and symptoms of diabetes, such as: ? Changes in your vision. ? Tingling or numbness in your hands or feet. ? Changes in hunger, thirst, and urination that are not otherwise explained by your pregnancy. What is being tested? This test measures the amount of glucose in your blood at different times during a period of 3 hours. This indicates how well your body is able to process glucose. What kind of sample is taken?  Blood samples are required for this test. They are usually collected by inserting a needle into a blood vessel. How do I prepare for this test?  For 3 days before your test, eat normally. Have plenty of carbohydrate-rich foods.  Follow instructions from your health care provider about: ? Eating or drinking restrictions on the day of the test. You may be asked to not eat or drink anything other than water (fast) starting 8-10 hours before the test. ? Changing or stopping your regular medicines. Some medicines may interfere with this test. Tell a health care provider about:  All  medicines you are taking, including vitamins, herbs, eye drops, creams, and over-the-counter medicines.  Any blood disorders you have.  Any surgeries you have had.  Any medical conditions you have. What happens during the test? First, your blood glucose will be measured. This is referred to as your fasting blood glucose, since you fasted before the test. Then, you will drink a glucose solution that contains a certain amount of glucose. Your blood glucose will be measured again 1, 2, and 3 hours after drinking the solution. This test takes about 3 hours to complete. You will need to stay at the testing location during this time. During the testing period:  Do not eat or drink anything other than the glucose solution.  Do not exercise.  Do not use any products that contain nicotine or tobacco, such as cigarettes and e-cigarettes. If you need help stopping, ask your health care provider. The testing procedure may vary among health care providers and hospitals. How are the results reported? Your results will be reported as milligrams of glucose per deciliter of blood (mg/dL) or millimoles per liter (mmol/L). Your health care provider will compare your results to normal ranges that were established after testing a large group of people (reference ranges). Reference ranges may vary among labs and hospitals. For this test, common reference ranges are:  Fasting: less than 95-105 mg/dL (5.3-5.8 mmol/L).  1 hour after drinking glucose: less than 180-190 mg/dL (10.0-10.5 mmol/L).  2 hours after drinking glucose: less than 155-165 mg/dL (8.6-9.2 mmol/L).  3 hours after drinking glucose: 140-145 mg/dL (7.8-8.1 mmol/L). What do the   results mean? Results within reference ranges are considered normal, meaning that your glucose levels are well-controlled. If two or more of your blood glucose levels are high, you may be diagnosed with gestational diabetes. If only one level is high, your health care  provider may suggest repeat testing or other tests to confirm a diagnosis. Talk with your health care provider about what your results mean. Questions to ask your health care provider Ask your health care provider, or the department that is doing the test:  When will my results be ready?  How will I get my results?  What are my treatment options?  What other tests do I need?  What are my next steps? Summary  The glucose tolerance test (GTT) is one of several tests used to diagnose diabetes that develops during pregnancy (gestational diabetes mellitus). Gestational diabetes is a temporary form of diabetes that some women develop during pregnancy.  You may have the GTT test after having a 1-hour glucose screening test if the results from that test indicate that you may have gestational diabetes. You may also have this test if you have any symptoms or risk factors for gestational diabetes.  Talk with your health care provider about what your results mean. This information is not intended to replace advice given to you by your health care provider. Make sure you discuss any questions you have with your health care provider. Document Revised: 01/13/2019 Document Reviewed: 05/04/2017 Elsevier Patient Education  2020 Else[employer] Inc[employer] Second Trimester of Pregnancy  The second trimester is from week 14 through week 27 (month 4 through 6). This is often the time in pregnancy that you feel your best. Often times, morning sickness has lessened or quit. You may have more energy, and you may get hungry more often. Your unborn baby is growing rapidly. At the end of the sixth month, he or she is about 9 inches long and weighs about 1 pounds. You will likely feel the baby move between 18 and 20 weeks of pregnancy. Follow these instructions at home: Medicines  Take over-the-counter and prescription medicines only as told by your doctor. Some medicines are safe and some medicines are not safe during  pregnancy.  Take a prenatal vitamin that contains at least 600 micrograms (mcg) of folic acid.  If you have trouble pooping (constipation), take medicine that will make your stool soft (stool softener) if your doctor approves. Eating and drinking   Eat regular, healthy meals.  Avoid raw meat and uncooked cheese.  If you get low calcium from the food you eat, talk to your doctor about taking a daily calcium supplement.  Avoid foods that are high in fat and sugars, such as fried and sweet foods.  If you feel sick to your stomach (nauseous) or throw up (vomit): ? Eat 4 or 5 small meals a day instead of 3 large meals. ? Try eating a few soda crackers. ? Drink liquids between meals instead of during meals.  To prevent constipation: ? Eat foods that are high in fiber, like fresh fruits and vegetables, whole grains, and beans. ? Drink enough fluids to keep your pee (urine) clear or pale yellow. Activity  Exercise only as told by your doctor. Stop exercising if you start to have cramps.  Do not exercise if it is too hot, too humid, or if you are in a place of great height (high altitude).  Avoid heavy lifting.  Wear low-heeled shoes. Sit and stand up straight.  You can continue   to have sex unless your doctor tells you not to. Relieving pain and discomfort  Wear a good support bra if your breasts are tender.  Take warm water baths (sitz baths) to soothe pain or discomfort caused by hemorrhoids. Use hemorrhoid cream if your doctor approves.  Rest with your legs raised if you have leg cramps or low back pain.  If you develop puffy, bulging veins (varicose veins) in your legs: ? Wear support hose or compression stockings as told by your doctor. ? Raise (elevate) your feet for 15 minutes, 3-4 times a day. ? Limit salt in your food. Prenatal care  Write down your questions. Take them to your prenatal visits.  Keep all your prenatal visits as told by your doctor. This is  important. Safety  Wear your seat belt when driving.  Make a list of emergency phone numbers, including numbers for family, friends, the hospital, and police and fire departments. General instructions  Ask your doctor about the right foods to eat or for help finding a counselor, if you need these services.  Ask your doctor about local prenatal classes. Begin classes before month 6 of your pregnancy.  Do not use hot tubs, steam rooms, or saunas.  Do not douche or use tampons or scented sanitary pads.  Do not cross your legs for long periods of time.  Visit your dentist if you have not done so. Use a soft toothbrush to brush your teeth. Floss gently.  Avoid all smoking, herbs, and alcohol. Avoid drugs that are not approved by your doctor.  Do not use any products that contain nicotine or tobacco, such as cigarettes and e-cigarettes. If you need help quitting, ask your doctor.  Avoid cat litter boxes and soil used by cats. These carry germs that can cause birth defects in the baby and can cause a loss of your baby (miscarriage) or stillbirth. Contact a doctor if:  You have mild cramps or pressure in your lower belly.  You have pain when you pee (urinate).  You have bad smelling fluid coming from your vagina.  You continue to feel sick to your stomach (nauseous), throw up (vomit), or have watery poop (diarrhea).  You have a nagging pain in your belly area.  You feel dizzy. Get help right away if:  You have a fever.  You are leaking fluid from your vagina.  You have spotting or bleeding from your vagina.  You have severe belly cramping or pain.  You lose or gain weight rapidly.  You have trouble catching your breath and have chest pain.  You notice sudden or extreme puffiness (swelling) of your face, hands, ankles, feet, or legs.  You have not felt the baby move in over an hour.  You have severe headaches that do not go away when you take medicine.  You have  trouble seeing. Summary  The second trimester is from week 14 through week 27 (months 4 through 6). This is often the time in pregnancy that you feel your best.  To take care of yourself and your unborn baby, you will need to eat healthy meals, take medicines only if your doctor tells you to do so, and do activities that are safe for you and your baby.  Call your doctor if you get sick or if you notice anything unusual about your pregnancy. Also, call your doctor if you need help with the right food to eat, or if you want to know what activities are safe for you. This   information is not intended to replace advice given to you by your health care provider. Make sure you discuss any questions you have with your health care provider. Document Revised: 01/14/2019 Document Reviewed: 10/28/2016 Elsevier Patient Education  2020 Elsevier Inc.  

## 2019-11-24 ENCOUNTER — Ambulatory Visit (HOSPITAL_COMMUNITY): Payer: Managed Care, Other (non HMO)

## 2019-11-24 ENCOUNTER — Ambulatory Visit (HOSPITAL_COMMUNITY): Admission: RE | Admit: 2019-11-24 | Payer: Managed Care, Other (non HMO) | Source: Ambulatory Visit

## 2019-11-30 ENCOUNTER — Ambulatory Visit (INDEPENDENT_AMBULATORY_CARE_PROVIDER_SITE_OTHER): Payer: Managed Care, Other (non HMO) | Admitting: Obstetrics and Gynecology

## 2019-11-30 ENCOUNTER — Other Ambulatory Visit: Payer: Self-pay

## 2019-11-30 ENCOUNTER — Encounter: Payer: Self-pay | Admitting: Obstetrics and Gynecology

## 2019-11-30 ENCOUNTER — Other Ambulatory Visit: Payer: Managed Care, Other (non HMO)

## 2019-11-30 VITALS — BP 125/77 | HR 96 | Wt 219.0 lb

## 2019-11-30 DIAGNOSIS — O0992 Supervision of high risk pregnancy, unspecified, second trimester: Secondary | ICD-10-CM

## 2019-11-30 DIAGNOSIS — Z23 Encounter for immunization: Secondary | ICD-10-CM | POA: Diagnosis not present

## 2019-11-30 DIAGNOSIS — Z3A26 26 weeks gestation of pregnancy: Secondary | ICD-10-CM

## 2019-11-30 DIAGNOSIS — Z98891 History of uterine scar from previous surgery: Secondary | ICD-10-CM

## 2019-11-30 DIAGNOSIS — Z8751 Personal history of pre-term labor: Secondary | ICD-10-CM

## 2019-11-30 DIAGNOSIS — O24410 Gestational diabetes mellitus in pregnancy, diet controlled: Secondary | ICD-10-CM

## 2019-11-30 NOTE — Progress Notes (Signed)
   PRENATAL VISIT NOTE  Subjective:  Loretta Schmitt is a 29 y.o. [phone] at [redacted]w[redacted]d being seen today for ongoing prenatal care.  She is currently monitored for the following issues for this high-risk pregnancy and has Morning sickness; H/O: C-section; History of preterm delivery; and Supervision of high risk pregnancy in second trimester on their problem list.  Patient reports no complaints.  Contractions: Not present. Vag. Bleeding: None.  Movement: Present. Denies leaking of fluid.   The following portions of the patient's history were reviewed and updated as appropriate: allergies, current medications, past family history, past medical history, past social history, past surgical history and problem list.   Objective:   Vitals:   11/30/19 0944  BP: 125/77  Pulse: 96  Weight: 219 lb (99.3 kg)    Fetal Status:     Movement: Present     General:  Alert, oriented and cooperative. Patient is in no acute distress.  Skin: Skin is warm and dry. No rash noted.   Cardiovascular: Normal heart rate noted  Respiratory: Normal respiratory effort, no problems with respiration noted  Abdomen: Soft, gravid, appropriate for gestational age.  Pain/Pressure: Absent     Pelvic: Cervical exam deferred        Extremities: Normal range of motion.  Edema: Trace  Mental Status: Normal mood and affect. Normal behavior. Normal judgment and thought content.   Assessment and Plan:  Pregnancy: [id number] at [redacted]w[redacted]d 1. Supervision of high risk pregnancy in second trimester Patient is doing well without complaints Third trimester labs today and tdap Follow up ultrasound on 3/3  2. History of preterm delivery Continue weekly 17-P  3. H/O: C-section Patient desires repeat c-section and plans birth control pills for contraception  Preterm labor symptoms and general obstetric precautions including but not limited to vaginal bleeding, contractions, leaking of fluid and fetal movement were reviewed in detail with the  patient. Please refer to After Visit Summary for other counseling recommendations.   Return in about 2 weeks (around 12/14/2019) for Virtual, High risk, ROB.  Future Appointments  Date Time Provider Department Center  12/07/2019  3:30 PM [facility] NURSE [facility]  12/07/2019  3:30 PM [facility] [place] 1 [facility]    Catalina Antigua, MD

## 2019-11-30 NOTE — Addendum Note (Signed)
Addended by: Thom Chimes on: 11/30/2019 10:02 AM   Modules accepted: Orders

## 2019-12-01 ENCOUNTER — Other Ambulatory Visit: Payer: Self-pay

## 2019-12-01 DIAGNOSIS — O24410 Gestational diabetes mellitus in pregnancy, diet controlled: Secondary | ICD-10-CM

## 2019-12-01 LAB — CBC
Hematocrit: 31.7 % — ABNORMAL LOW (ref 34.0–46.6)
Hemoglobin: 10.7 g/dL — ABNORMAL LOW (ref 11.1–15.9)
MCH: 30.1 pg (ref 26.6–33.0)
MCHC: 33.8 g/dL (ref 31.5–35.7)
MCV: 89 fL (ref 79–97)
Platelets: 274 10*3/uL (ref 150–450)
RBC: 3.55 x10E6/uL — ABNORMAL LOW (ref 3.77–5.28)
RDW: 11.8 % (ref 11.7–15.4)
WBC: 9.7 10*3/uL (ref 3.4–10.8)

## 2019-12-01 LAB — GLUCOSE TOLERANCE, 2 HOURS W/ 1HR
Glucose, 1 hour: 186 mg/dL — ABNORMAL HIGH (ref 65–179)
Glucose, 2 hour: 128 mg/dL (ref 65–152)
Glucose, Fasting: 83 mg/dL (ref 65–91)

## 2019-12-01 LAB — HIV ANTIBODY (ROUTINE TESTING W REFLEX): HIV Screen 4th Generation wRfx: NONREACTIVE

## 2019-12-01 LAB — RPR: RPR Ser Ql: NONREACTIVE

## 2019-12-01 MED ORDER — ACCU-CHEK GUIDE W/DEVICE KIT: 1.0000 | PACK | Freq: Four times a day (QID) | 0 refills | Status: DC

## 2019-12-01 MED ORDER — ACCU-CHEK GUIDE VI STRP: ORAL_STRIP | 12 refills | Status: DC

## 2019-12-01 MED ORDER — ACCU-CHEK FASTCLIX LANCETS MISC: 1.0000 [IU] | Freq: Four times a day (QID) | 12 refills | Status: DC

## 2019-12-01 NOTE — Addendum Note (Signed)
Addended by: Catalina Antigua on: 12/01/2019 10:50 AM   Modules accepted: Orders

## 2019-12-01 NOTE — Progress Notes (Signed)
Diabetic blood glucose monitoring supplies sent to pts pharmacy. Pt made aware.

## 2019-12-07 ENCOUNTER — Ambulatory Visit (HOSPITAL_COMMUNITY): Payer: Managed Care, Other (non HMO) | Admitting: *Deleted

## 2019-12-07 ENCOUNTER — Other Ambulatory Visit: Payer: Self-pay

## 2019-12-07 ENCOUNTER — Ambulatory Visit (HOSPITAL_COMMUNITY)
Admission: RE | Admit: 2019-12-07 | Discharge: 2019-12-07 | Disposition: A | Discharge status: Home / Self Care | Payer: Managed Care, Other (non HMO) | Source: Ambulatory Visit | Attending: Obstetrics and Gynecology | Admitting: Obstetrics and Gynecology

## 2019-12-07 ENCOUNTER — Encounter (HOSPITAL_COMMUNITY): Payer: Self-pay

## 2019-12-07 DIAGNOSIS — O99212 Obesity complicating pregnancy, second trimester: Secondary | ICD-10-CM

## 2019-12-07 DIAGNOSIS — O210 Mild hyperemesis gravidarum: Secondary | ICD-10-CM | POA: Insufficient documentation

## 2019-12-07 DIAGNOSIS — O24410 Gestational diabetes mellitus in pregnancy, diet controlled: Secondary | ICD-10-CM

## 2019-12-07 DIAGNOSIS — O09212 Supervision of pregnancy with history of pre-term labor, second trimester: Secondary | ICD-10-CM | POA: Diagnosis not present

## 2019-12-07 DIAGNOSIS — Z362 Encounter for other antenatal screening follow-up: Secondary | ICD-10-CM | POA: Diagnosis not present

## 2019-12-07 DIAGNOSIS — Z3A27 27 weeks gestation of pregnancy: Secondary | ICD-10-CM

## 2019-12-08 ENCOUNTER — Other Ambulatory Visit (HOSPITAL_COMMUNITY): Payer: Self-pay | Admitting: *Deleted

## 2019-12-08 DIAGNOSIS — O99210 Obesity complicating pregnancy, unspecified trimester: Secondary | ICD-10-CM

## 2019-12-14 ENCOUNTER — Telehealth: Payer: Managed Care, Other (non HMO) | Admitting: Obstetrics and Gynecology

## 2019-12-20 ENCOUNTER — Encounter: Payer: Self-pay | Admitting: Obstetrics and Gynecology

## 2019-12-20 ENCOUNTER — Telehealth (INDEPENDENT_AMBULATORY_CARE_PROVIDER_SITE_OTHER): Payer: Managed Care, Other (non HMO) | Admitting: Obstetrics and Gynecology

## 2019-12-20 DIAGNOSIS — Z8751 Personal history of pre-term labor: Secondary | ICD-10-CM

## 2019-12-20 DIAGNOSIS — O09213 Supervision of pregnancy with history of pre-term labor, third trimester: Secondary | ICD-10-CM

## 2019-12-20 DIAGNOSIS — Z3A28 28 weeks gestation of pregnancy: Secondary | ICD-10-CM

## 2019-12-20 DIAGNOSIS — O0992 Supervision of high risk pregnancy, unspecified, second trimester: Secondary | ICD-10-CM

## 2019-12-20 DIAGNOSIS — O210 Mild hyperemesis gravidarum: Secondary | ICD-10-CM

## 2019-12-20 DIAGNOSIS — Z98891 History of uterine scar from previous surgery: Secondary | ICD-10-CM

## 2019-12-20 DIAGNOSIS — O34219 Maternal care for unspecified type scar from previous cesarean delivery: Secondary | ICD-10-CM

## 2019-12-20 DIAGNOSIS — O24410 Gestational diabetes mellitus in pregnancy, diet controlled: Secondary | ICD-10-CM

## 2019-12-20 MED ORDER — OMEPRAZOLE 20 MG PO CPDR: 20.0000 mg | DELAYED_RELEASE_CAPSULE | Freq: Every day | ORAL | 2 refills | Status: DC

## 2019-12-20 MED ORDER — DICLEGIS 10-10 MG PO TBEC: 2.0000 | DELAYED_RELEASE_TABLET | Freq: Every day | ORAL | 2 refills | Status: DC

## 2019-12-20 NOTE — Progress Notes (Signed)
Pt is unable to check BP today. Pt states she would like second opinion of her glucose test- not currently checking sugars. Pt complains of indigestion and heartburn- has taken Tums.

## 2019-12-20 NOTE — Progress Notes (Signed)
TELEHEALTH OBSTETRICS PRENATAL VIRTUAL VIDEO VISIT ENCOUNTER NOTE  Provider location: [facility] [place]   I connected with Loretta Schmitt on 12/20/19 at  4:00 PM EDT by MyChart Video Encounter at home and verified that I am speaking with the correct person using two identifiers.   I discussed the limitations, risks, security and privacy concerns of performing an evaluation and management service virtually and the availability of in person appointments. I also discussed with the patient that there may be a patient responsible charge related to this service. The patient expressed understanding and agreed to proceed. Subjective:  Loretta Schmitt is a 29 y.o. [phone] at [redacted]w[redacted]d being seen today for ongoing prenatal care.  She is currently monitored for the following issues for this high-risk pregnancy and has Morning sickness; H/O: C-section; History of preterm delivery; Supervision of high risk pregnancy in second trimester; and GDM (gestational diabetes mellitus) on their problem list.  Patient reports general discomforts of pregnancy.  Contractions: Not present. Vag. Bleeding: None.  Movement: Present. Denies any leaking of fluid.   The following portions of the patient's history were reviewed and updated as appropriate: allergies, current medications, past family history, past medical history, past social history, past surgical history and problem list.   Objective:  There were no vitals filed for this visit.  Fetal Status:     Movement: Present     General:  Alert, oriented and cooperative. Patient is in no acute distress.  Respiratory: Normal respiratory effort, no problems with respiration noted  Mental Status: Normal mood and affect. Normal behavior. Normal judgment and thought content.  Rest of physical exam deferred due to type of encounter  Imaging: [place] MFM OB FOLLOW UP  Result Date:  12/07/2019 ----------------------------------------------------------------------  OBSTETRICS REPORT                       (Signed Final 12/07/2019 04:49 pm) ---------------------------------------------------------------------- Patient Info  ID #:       [id number]                          D.O.B.:  [birth year] (29 yrs)  Name:       Loretta Schmitt                  Visit Date: 12/07/2019 04:29 pm ---------------------------------------------------------------------- Performed By  Performed By:     Ellin Saba        Ref. Address:     [address] [place],                                                             [place] [address]  Attending:        Noralee Space MD        [facility]  Referred By:      Lavonia Drafts MD ---------------------------------------------------------------------- Orders   #  Description                          Code         Ordered By   1  [place] MFM OB FOLLOW UP                  [phone]     [facility]  ----------------------------------------------------------------------   #  Order #                    Accession #                 Episode #   1  [id number]                  [id number]                  [id number]  ---------------------------------------------------------------------- Indications   Antenatal follow-up for nonvisualized fetal    Z36.2   anatomy   [redacted] weeks gestation of pregnancy                T2W.58   Obesity complicating pregnancy, second         O99.212   trimester   Poor obstetric history: Previous preterm       O09.219   delivery, antepartum (makena) 28 weeks   Previous cesarean delivery, antepartum         O34.219   Encounter for antenatal screening for          Z36.3   malformations (neg AFP, Neg horizon)   ---------------------------------------------------------------------- Vital Signs  Weight (lb): 219                               Height:        5'3"  BMI:         38.79 ---------------------------------------------------------------------- Fetal Evaluation  Num Of Fetuses:         1  Fetal Heart Rate(bpm):  155  Cardiac Activity:       Observed  Presentation:           Cephalic  Placenta:               Posterior  P. Cord Insertion:      Previously Visualized  Amniotic Fluid  AFI FV:      Within normal limits                              Largest Pocket(cm)  4.83 ---------------------------------------------------------------------- Biometry  BPD:        70  mm     G. Age:  28w 1d         76  %    CI:        77.07   %    70 - 86                                                          FL/HC:      20.8   %    18.6 - 20.4  HC:      252.5  mm     G. Age:  27w 3d         35  %    HC/AC:      1.06        1.05 - 1.21  AC:      237.8  mm     G. Age:  28w 1d         75  %    FL/BPD:     75.0   %    71 - 87  FL:       52.5  mm     G. Age:  28w 0d         64  %    FL/AC:      22.1   %    20 - 24  HUM:      45.5  mm     G. Age:  26w 6d         44  %  CER:      32.9  mm     G. Age:  28w 6d         79  %  LV:        6.3  mm  CM:        6.7  mm  Est. FW:    1155  gm      2 lb 9 oz     77  % ---------------------------------------------------------------------- OB History  Gravidity:    3         Prem:   1  TOP:          1 ---------------------------------------------------------------------- Gestational Age  LMP:           27w 0d        Date:  06/01/19                 EDD:   03/07/20  U/S Today:     28w 0d                                        EDD:   02/29/20  Best:          27w 0d     Det. By:  LMP  (06/01/19)          EDD:   03/07/20 ---------------------------------------------------------------------- Anatomy  Cranium:               Appears normal         LVOT:  Appears normal   Cavum:                 Appears normal         Aortic Arch:            Appears normal  Ventricles:            Appears normal         Ductal Arch:            Appears normal  Choroid Plexus:        Appears normal         Diaphragm:              Appears normal  Cerebellum:            Appears normal         Stomach:                Appears normal, left                                                                        sided  Posterior Fossa:       Previously seen        Abdomen:                Appears normal  Nuchal Fold:           Previously seen        Abdominal Wall:         Previously seen  Face:                  Not well visualized    Cord Vessels:           Appears normal (3                                                                        vessel cord)  Lips:                  Appears normal         Kidneys:                Appear normal  Palate:                Not well visualized    Bladder:                Appears normal  Thoracic:              Appears normal         Spine:                  Previously seen  Heart:                 Appears normal         Upper Extremities:      Previously seen                         (  4CH, axis, and                         situs)  RVOT:                  Appears normal         Lower Extremities:      Previously seen  Other:  Fetus appears to be a female. 5th digit prev. visualized. Technically          difficult due to maternal habitus and fetal position. ---------------------------------------------------------------------- Cervix Uterus Adnexa  Cervix  Length:           3.19  cm.  Normal appearance by transabdominal scan. ---------------------------------------------------------------------- Impression  Patient return for completion of fetal anatomy.  Fetal growth is  appropriate for gestational age.  Amniotic fluid is normal and  good fetal activity seen.  She has gestational diabetes and desires to repeat  screening.  I reassured her that the screening was performed  at an  appropriate gestational age and repeat screening is not  necessary.  I recommended that she meet with the diabetic educator. ---------------------------------------------------------------------- Recommendations  -An appointment was made for her to return in 4 weeks for  fetal growth assessment. ----------------------------------------------------------------------                  Noralee Spaceavi Shankar, MD Electronically Signed Final Report   12/07/2019 04:49 pm ----------------------------------------------------------------------   Assessment and Plan:  Pregnancy: [id number] at 6088w6d 1. Diet controlled gestational diabetes mellitus (GDM) in third trimester Reviewed with pt. CBG goals and CBG testing reviewed with  Pt's sister had 1 hour Glucola and failed but passed 3 hr GTT, thus she was requesting a repeat test. Differences in the testing was explained to the pt and that no additional testing was needed. She verbalized understanding and agreed to see DM educator.    2. Morning sickness  - DICLEGIS 10-10 MG TBEC; Take 2 tablets by mouth at bedtime. Take 2 tablets at bedtime. If not helping can increase to 1 tablet in morning in addition to bedtime on day 3.  Dispense: 60 tablet; Refill: 2  3. Supervision of high risk pregnancy in second trimester Stable U/S growth appropriate on 12/07/19, f/u scheduled - omeprazole (PRILOSEC) 20 MG capsule; Take 1 capsule (20 mg total) by mouth daily.  Dispense: 30 capsule; Refill: 2  4. H/O: C-section   5. History of preterm delivery Stable on weekly 17 OHP, continue  Preterm labor symptoms and general obstetric precautions including but not limited to vaginal bleeding, contractions, leaking of fluid and fetal movement were reviewed in detail with the patient. I discussed the assessment and treatment plan with the patient. The patient was provided an opportunity to ask questions and all were answered. The patient agreed with the plan and demonstrated an  understanding of the instructions. The patient was advised to call back or seek an in-person office evaluation/[facility] for any urgent or concerning symptoms. Please refer to After Visit Summary for other counseling recommendations.   I provided 10 minutes of face-to-face time during this encounter.  Return in about 2 weeks (around 01/03/2020) for OB visit, face to face, MD provider, review CBG's.  Future Appointments  Date Time Provider Department Center  01/05/2020  3:30 PM [facility] NURSE [facility]  01/05/2020  3:30 PM [facility] US 1 [facility]    Hermina StaggersMichael L Nicholas Ossa, MD [facility]

## 2020-01-02 ENCOUNTER — Telehealth: Payer: Self-pay

## 2020-01-02 NOTE — Telephone Encounter (Signed)
Pt called and reports she thinks she may have a UTI, she has been experiencing symptoms for a couple days. I advised pt that she will have to come in and leave a urine sample before any treatment can be given. Pt reports she is not sure if she will be able to come today, but she does have an appt tomorrow already. Pt states she will call back and let us know if she can come in today.

## 2020-01-03 ENCOUNTER — Other Ambulatory Visit: Payer: Self-pay

## 2020-01-03 ENCOUNTER — Encounter: Payer: Managed Care, Other (non HMO) | Admitting: Obstetrics & Gynecology

## 2020-01-03 ENCOUNTER — Ambulatory Visit (INDEPENDENT_AMBULATORY_CARE_PROVIDER_SITE_OTHER): Payer: Managed Care, Other (non HMO) | Admitting: Obstetrics & Gynecology

## 2020-01-03 VITALS — BP 117/74 | HR 112 | Wt 227.2 lb

## 2020-01-03 DIAGNOSIS — O24410 Gestational diabetes mellitus in pregnancy, diet controlled: Secondary | ICD-10-CM

## 2020-01-03 DIAGNOSIS — Z98891 History of uterine scar from previous surgery: Secondary | ICD-10-CM

## 2020-01-03 DIAGNOSIS — O0992 Supervision of high risk pregnancy, unspecified, second trimester: Secondary | ICD-10-CM

## 2020-01-03 DIAGNOSIS — Z3A30 30 weeks gestation of pregnancy: Secondary | ICD-10-CM

## 2020-01-03 LAB — POCT URINALYSIS DIPSTICK
Bilirubin, UA: NEGATIVE
Blood, UA: NEGATIVE
Glucose, UA: NEGATIVE
Ketones, UA: NEGATIVE
Leukocytes, UA: NEGATIVE
Nitrite, UA: NEGATIVE
Protein, UA: NEGATIVE
Spec Grav, UA: 1.015 (ref 1.010–1.025)
Urobilinogen, UA: 0.2 E.U./dL
pH, UA: 6.5 (ref 5.0–8.0)

## 2020-01-03 NOTE — Progress Notes (Signed)
Patient reports fetal movement, complains of wrist, leg, and pelvic pain. Pt reports discomfort with urination and says that it feels similar to when she had a uti, advised to leave sample.

## 2020-01-03 NOTE — Progress Notes (Signed)
   PRENATAL VISIT NOTE  Subjective:  Loretta Schmitt is a 29 y.o. [phone] at [redacted]w[redacted]d being seen today for ongoing prenatal care.  She is currently monitored for the following issues for this high-risk pregnancy and has Morning sickness; H/O: C-section; History of preterm delivery; Supervision of high risk pregnancy in second trimester; and GDM (gestational diabetes mellitus) on their problem list.  Patient reports fatigue and pressure, pain in feet with edema.  Contractions: Not present. Vag. Bleeding: None.  Movement: Present. Denies leaking of fluid.   The following portions of the patient's history were reviewed and updated as appropriate: allergies, current medications, past family history, past medical history, past social history, past surgical history and problem list.   Objective:   Vitals:   01/03/20 1611  BP: 117/74  Pulse: (!) 112  Weight: 227 lb 3.2 oz (103.1 kg)    Fetal Status: Fetal Heart Rate (bpm): 164   Movement: Present     General:  Alert, oriented and cooperative. Patient is in no acute distress.  Skin: Skin is warm and dry. No rash noted.   Cardiovascular: Normal heart rate noted  Respiratory: Normal respiratory effort, no problems with respiration noted  Abdomen: Soft, gravid, appropriate for gestational age.  Pain/Pressure: Present     Pelvic: Cervical exam deferred        Extremities: Normal range of motion.  Edema: Trace  Mental Status: Normal mood and affect. Normal behavior. Normal judgment and thought content.   Assessment and Plan:  Pregnancy: [id number] at [redacted]w[redacted]d 1. Supervision of high risk pregnancy in second trimester Failed 2 hr GTT, needs to keep appt for diabetes instruction. She asked if GTT could be repeated, not recommended - Culture, OB Urine - POCT urinalysis dipstick U/A neg, f/uculture 2. Diet controlled gestational diabetes mellitus (GDM) in third trimester DM education  3. H/O: C-section States she wants RCS. Pt. Counseled and given  information, rec sign form at next visit  Preterm labor symptoms and general obstetric precautions including but not limited to vaginal bleeding, contractions, leaking of fluid and fetal movement were reviewed in detail with the patient. Please refer to After Visit Summary for other counseling recommendations.   Return in about 2 weeks (around 01/17/2020) for virtual.  Future Appointments  Date Time Provider Department Center  01/05/2020  3:30 PM [facility] NURSE [facility]  01/05/2020  3:30 PM [facility] [place] 1 [facility] M[facility]-US  01/10/2020  4:30 PM Carolan Shiver, RD [facility] NDM    Scheryl Darter, MD

## 2020-01-03 NOTE — Patient Instructions (Signed)
Gestational Diabetes Mellitus, Diagnosis Gestational diabetes (gestational diabetes mellitus) is a short-term (temporary) form of diabetes that can happen during pregnancy. It goes away after you give birth. It may be caused by one or both of these problems:  Your pancreas does not make enough of a hormone called insulin.  Your body does not respond in a normal way to insulin that it makes. Insulin lets sugars (glucose) go into cells in the body. This gives you energy. If you have diabetes, sugars cannot get into cells. This causes high blood sugar (hyperglycemia). If you get gestational diabetes, you are:  More likely to get it if you get pregnant again.  More likely to develop type 2 diabetes in the future. If gestational diabetes is treated, it may not hurt you or your baby. Your doctor will set treatment goals for you. In general, you should have these blood sugar levels:  After not eating for a long time (fasting): 95 mg/dL (5.3 mmol/L).  After meals (postprandial): ? One hour after a meal: at or below 140 mg/dL (7.8 mmol/L). ? Two hours after a meal: at or below 120 mg/dL (6.7 mmol/L).  A1c (hemoglobin A1c) level: 6-6.5%. Follow these instructions at home: Questions to ask your doctor   You may want to ask these questions: ? Do I need to meet with a diabetes educator? ? What equipment will I need to care for myself at home? ? What medicines do I need? When should I take them? ? How often do I need to check my blood sugar? ? What number can I call if I have questions? ? When is my next doctor's visit? General instructions  Take over-the-counter and prescription medicines only as told by your doctor.  Stay at a healthy weight during pregnancy.  Keep all follow-up visits as told by your doctor. This is important. Contact a doctor if:  Your blood sugar is at or above 240 mg/dL (13.3 mmol/L).  Your blood sugar is at or above 200 mg/dL (11.1 mmol/L) and you have ketones in  your pee (urine).  You have been sick or have had a fever for 2 days or more and you are not getting better.  You have any of these problems for more than 6 hours: ? You cannot eat or drink. ? You feel sick to your stomach (nauseous). ? You throw up (vomit). ? You have watery poop (diarrhea). Get help right away if:  Your blood sugar is lower than 54 mg/dL (3 mmol/L).  You get confused.  You have trouble: ? Thinking clearly. ? Breathing.  Your baby moves less than normal.  You have any of these: ? Moderate or large ketone levels in your pee. ? Blood coming from your vagina. ? Unusual fluid coming from your vagina. ? Early contractions. These may feel like tightness in your belly. Summary  Gestational diabetes is a short-term form of diabetes. It can happen while you are pregnant. It goes away after you give birth.  If gestational diabetes is treated, it may not hurt you or your baby. Your doctor will set treatment goals for you.  Keep all follow-up visits as told by your doctor. This is important. This information is not intended to replace advice given to you by your health care provider. Make sure you discuss any questions you have with your health care provider. Document Revised: 10/29/2017 Document Reviewed: 10/26/2015 Elsevier Patient Education  [place]. Vaginal Birth After Cesarean Delivery  Vaginal birth after cesarean delivery (  VBAC) is giving birth vaginally after previously delivering a baby through a cesarean section (C-section). A VBAC may be a safe option for you, depending on your health and other factors. It is important to discuss VBAC with your health care provider early in your pregnancy so you can understand the risks, benefits, and options. Having these discussions early will give you time to make your birth plan. Who are the best candidates for VBAC? The best candidates for VBAC are women who:  Have had one or two prior cesarean deliveries,  and the incision made during the delivery was horizontal (low transverse).  Do not have a vertical (classical) scar on their uterus.  Have not had a tear in the wall of their uterus (uterine rupture).  Plan to have more pregnancies. A VBAC is also more likely to be successful:  In women who have previously given birth vaginally.  When labor starts by itself (spontaneously) before the due date. What are the benefits of VBAC? The benefits of delivering your baby vaginally instead of by a cesarean delivery include:  A shorter hospital stay.  A faster recovery time.  Less pain.  Avoiding risks associated with major surgery, such as infection and blood clots.  Less blood loss and less need for donated blood (transfusions). What are the risks of VBAC? The main risk of attempting a VBAC is that it may fail, forcing your health care provider to deliver your baby by a C-section. Other risks are rare and include:  Tearing (rupture) of the scar from a past cesarean delivery.  Other risks associated with vaginal deliveries. If a repeat cesarean delivery is needed, the risks include:  Blood loss.  Infection.  Blood clot.  Damage to surrounding organs.  Removal of the uterus (hysterectomy), if it is damaged.  Placenta problems in future pregnancies. What else should I know about my options? Delivering a baby through a VBAC is similar to having a normal spontaneous vaginal delivery. Therefore, it is safe:  To try with twins.  For your health care provider to try to turn the baby from a breech position (external cephalic version) during labor.  With epidural analgesia for pain relief. Consider where you would like to deliver your baby. VBAC should be attempted in facilities where an emergency cesarean delivery can be performed. VBAC is not recommended for home births. Any changes in your health or your baby's health during your pregnancy may make it necessary to change your  initial decision about VBAC. Your health care provider may recommend that you do not attempt a VBAC if:  Your baby's suspected weight is 8.8 lb (4 kg) or more.  You have preeclampsia. This is a condition that causes high blood pressure along with other symptoms, such as swelling and headaches.  You will have VBAC less than 19 months after your cesarean delivery.  You are past your due date.  You need to have labor started (induced) because your cervix is not ready for labor (unfavorable). Where to find more information  American Pregnancy Association: americanpregnancy.org  [employer]: acog.org Summary  Vaginal birth after cesarean delivery (VBAC) is giving birth vaginally after previously delivering a baby through a cesarean section (C-section). A VBAC may be a safe option for you, depending on your health and other factors.  Discuss VBAC with your health care provider early in your pregnancy so you can understand the risks, benefits, options, and have plenty of time to make your birth plan.  The main risk of attempting a VBAC is that it may fail, forcing your health care provider to deliver your baby by a C-section. Other risks are rare. This information is not intended to replace advice given to you by your health care provider. Make sure you discuss any questions you have with your health care provider. Document Revised: 01/18/2019 Document Reviewed: 12/30/2016 Elsevier Patient Education  2020 [employer].

## 2020-01-04 ENCOUNTER — Ambulatory Visit: Payer: Managed Care, Other (non HMO)

## 2020-01-05 ENCOUNTER — Encounter (HOSPITAL_COMMUNITY): Payer: Self-pay

## 2020-01-05 ENCOUNTER — Other Ambulatory Visit: Payer: Self-pay

## 2020-01-05 ENCOUNTER — Ambulatory Visit (HOSPITAL_COMMUNITY)
Admission: RE | Admit: 2020-01-05 | Discharge: 2020-01-05 | Disposition: A | Discharge status: Home / Self Care | Payer: Managed Care, Other (non HMO) | Source: Ambulatory Visit | Attending: Obstetrics and Gynecology | Admitting: Obstetrics and Gynecology

## 2020-01-05 ENCOUNTER — Ambulatory Visit (HOSPITAL_COMMUNITY): Payer: Managed Care, Other (non HMO) | Admitting: *Deleted

## 2020-01-05 DIAGNOSIS — O99213 Obesity complicating pregnancy, third trimester: Secondary | ICD-10-CM | POA: Diagnosis not present

## 2020-01-05 DIAGNOSIS — Z3A31 31 weeks gestation of pregnancy: Secondary | ICD-10-CM

## 2020-01-05 DIAGNOSIS — O210 Mild hyperemesis gravidarum: Secondary | ICD-10-CM | POA: Insufficient documentation

## 2020-01-05 DIAGNOSIS — O99210 Obesity complicating pregnancy, unspecified trimester: Secondary | ICD-10-CM | POA: Diagnosis not present

## 2020-01-05 DIAGNOSIS — O24410 Gestational diabetes mellitus in pregnancy, diet controlled: Secondary | ICD-10-CM | POA: Diagnosis not present

## 2020-01-05 DIAGNOSIS — O09213 Supervision of pregnancy with history of pre-term labor, third trimester: Secondary | ICD-10-CM | POA: Diagnosis not present

## 2020-01-05 DIAGNOSIS — O34219 Maternal care for unspecified type scar from previous cesarean delivery: Secondary | ICD-10-CM

## 2020-01-05 DIAGNOSIS — Z362 Encounter for other antenatal screening follow-up: Secondary | ICD-10-CM

## 2020-01-05 LAB — CULTURE, OB URINE

## 2020-01-05 LAB — URINE CULTURE, OB REFLEX

## 2020-01-09 ENCOUNTER — Other Ambulatory Visit (HOSPITAL_COMMUNITY): Payer: Self-pay | Admitting: *Deleted

## 2020-01-09 DIAGNOSIS — O24410 Gestational diabetes mellitus in pregnancy, diet controlled: Secondary | ICD-10-CM

## 2020-01-10 ENCOUNTER — Encounter: Payer: Managed Care, Other (non HMO) | Attending: Obstetrics and Gynecology | Admitting: Registered"

## 2020-01-10 ENCOUNTER — Encounter: Payer: Self-pay | Admitting: Registered"

## 2020-01-10 ENCOUNTER — Other Ambulatory Visit: Payer: Self-pay

## 2020-01-10 DIAGNOSIS — O24410 Gestational diabetes mellitus in pregnancy, diet controlled: Secondary | ICD-10-CM | POA: Diagnosis not present

## 2020-01-10 NOTE — Progress Notes (Signed)
Patient was seen on 01/10/20 for Gestational Diabetes self-management education at [facility]. The following learning objectives were met by the patient during this course:   States the definition of Gestational Diabetes  States why dietary management is important in controlling blood glucose  Describes the effects each nutrient has on blood glucose levels  Demonstrates ability to create a balanced meal plan  Demonstrates carbohydrate counting   States when to check blood glucose levels  Demonstrates proper blood glucose monitoring techniques  States the effect of stress and exercise on blood glucose levels  States the importance of limiting caffeine and abstaining from alcohol and smoking  Blood glucose monitor given: [employer] Me Lot #[id number] Exp: 03/07/21 Blood Glucose: 91 mg/dL  Patient instructed to monitor glucose levels: FBS: 60 - <95; 1 hour: <140; 2 hour: <120  Patient received handouts:  Nutrition Diabetes and Pregnancy, including carb counting list  Patient will be seen for follow-up as needed.

## 2020-01-11 ENCOUNTER — Other Ambulatory Visit: Payer: Self-pay

## 2020-01-11 DIAGNOSIS — O0992 Supervision of high risk pregnancy, unspecified, second trimester: Secondary | ICD-10-CM

## 2020-01-11 MED ORDER — ACCU-CHEK SOFTCLIX LANCETS MISC: 12 refills | Status: DC

## 2020-01-11 NOTE — Progress Notes (Signed)
Softclix lancets ordered.

## 2020-01-12 ENCOUNTER — Ambulatory Visit (INDEPENDENT_AMBULATORY_CARE_PROVIDER_SITE_OTHER): Payer: Managed Care, Other (non HMO) | Admitting: Obstetrics

## 2020-01-12 ENCOUNTER — Other Ambulatory Visit (HOSPITAL_COMMUNITY)
Admission: RE | Admit: 2020-01-12 | Discharge: 2020-01-12 | Disposition: A | Discharge status: Home / Self Care | Payer: Managed Care, Other (non HMO) | Source: Ambulatory Visit | Attending: Obstetrics | Admitting: Obstetrics

## 2020-01-12 ENCOUNTER — Encounter: Payer: Self-pay | Admitting: Obstetrics

## 2020-01-12 VITALS — BP 117/74 | HR 118 | Wt 229.2 lb

## 2020-01-12 DIAGNOSIS — B369 Superficial mycosis, unspecified: Secondary | ICD-10-CM

## 2020-01-12 DIAGNOSIS — O0992 Supervision of high risk pregnancy, unspecified, second trimester: Secondary | ICD-10-CM

## 2020-01-12 DIAGNOSIS — Z8751 Personal history of pre-term labor: Secondary | ICD-10-CM

## 2020-01-12 DIAGNOSIS — O219 Vomiting of pregnancy, unspecified: Secondary | ICD-10-CM

## 2020-01-12 DIAGNOSIS — O99213 Obesity complicating pregnancy, third trimester: Secondary | ICD-10-CM

## 2020-01-12 DIAGNOSIS — N898 Other specified noninflammatory disorders of vagina: Secondary | ICD-10-CM | POA: Diagnosis not present

## 2020-01-12 DIAGNOSIS — Z3A32 32 weeks gestation of pregnancy: Secondary | ICD-10-CM

## 2020-01-12 DIAGNOSIS — O26893 Other specified pregnancy related conditions, third trimester: Secondary | ICD-10-CM

## 2020-01-12 DIAGNOSIS — O26899 Other specified pregnancy related conditions, unspecified trimester: Secondary | ICD-10-CM | POA: Diagnosis not present

## 2020-01-12 DIAGNOSIS — O0993 Supervision of high risk pregnancy, unspecified, third trimester: Secondary | ICD-10-CM

## 2020-01-12 DIAGNOSIS — O24410 Gestational diabetes mellitus in pregnancy, diet controlled: Secondary | ICD-10-CM

## 2020-01-12 DIAGNOSIS — B3731 Acute candidiasis of vulva and vagina: Secondary | ICD-10-CM

## 2020-01-12 DIAGNOSIS — Z98891 History of uterine scar from previous surgery: Secondary | ICD-10-CM

## 2020-01-12 DIAGNOSIS — O99210 Obesity complicating pregnancy, unspecified trimester: Secondary | ICD-10-CM

## 2020-01-12 DIAGNOSIS — E669 Obesity, unspecified: Secondary | ICD-10-CM

## 2020-01-12 DIAGNOSIS — B373 Candidiasis of vulva and vagina: Secondary | ICD-10-CM

## 2020-01-12 MED ORDER — DOXYLAMINE-PYRIDOXINE 10-10 MG PO TBEC: DELAYED_RELEASE_TABLET | ORAL | 5 refills | Status: DC

## 2020-01-12 MED ORDER — CLOTRIMAZOLE 1 % EX CREA: 1.0000 "application " | TOPICAL_CREAM | Freq: Two times a day (BID) | CUTANEOUS | 2 refills | Status: DC

## 2020-01-12 MED ORDER — TERCONAZOLE 0.8 % VA CREA: 1.0000 | TOPICAL_CREAM | Freq: Every day | VAGINAL | 2 refills | Status: DC

## 2020-01-12 NOTE — Progress Notes (Signed)
Patient reports fetal movement, complains of pain in both wrists with movement, wants rx sent. Pt also requests ne rx for diclegis. Pt complains of irritation between thighs and vaginal discharge.

## 2020-01-12 NOTE — Progress Notes (Signed)
Subjective:  Loretta Schmitt is a 29 y.o. [phone] at [redacted]w[redacted]d being seen today for ongoing prenatal care.  She is currently monitored for the following issues for this high-risk pregnancy and has Morning sickness; H/O: C-section; History of preterm delivery; Supervision of high risk pregnancy in second trimester; and Diet controlled gestational diabetes mellitus (GDM) in third trimester on their problem list.  Patient reports carpal tunnel symptoms, nausea, vaginal irritation, vomiting and rash between legs.  Contractions: Not present. Vag. Bleeding: None.  Movement: Present. Denies leaking of fluid.   The following portions of the patient's history were reviewed and updated as appropriate: allergies, current medications, past family history, past medical history, past social history, past surgical history and problem list. Problem list updated.  Objective:   Vitals:   01/12/20 1623  BP: 117/74  Pulse: (!) 118  Weight: 229 lb 3.2 oz (104 kg)    Fetal Status:     Movement: Present     General:  Alert, oriented and cooperative. Patient is in no acute distress.  Skin: Skin is warm and dry. No rash noted.   Cardiovascular: Normal heart rate noted  Respiratory: Normal respiratory effort, no problems with respiration noted  Abdomen: Soft, gravid, appropriate for gestational age. Pain/Pressure: Present     Pelvic:  Cervical exam deferred        Extremities: Normal range of motion.  Edema: Trace  Mental Status: Normal mood and affect. Normal behavior. Normal judgment and thought content.   Urinalysis:      Assessment and Plan:  Pregnancy: [id number] at [redacted]w[redacted]d  1. Supervision of high risk pregnancy in second trimester  2. History of preterm delivery  3. Diet controlled gestational diabetes mellitus (GDM) in third trimester  4. H/O: C-section - desires repeat C/S  5. Obesity affecting pregnancy, antepartum  6. Vaginal discharge during pregnancy, antepartum Rx: - Cervicovaginal ancillary  only  7. Candida vaginitis Rx: - terconazole (TERAZOL 3) 0.8 % vaginal cream; Place 1 applicator vaginally at bedtime.  Dispense: 20 g; Refill: 2  8. Superficial fungus infection of skin Rx: - clotrimazole (LOTRIMIN) 1 % cream; Apply 1 application topically 2 (two) times daily.  Dispense: 113 g; Refill: 2  9. Nausea and vomiting during pregnancy Rx: - Doxylamine-Pyridoxine (DICLEGIS) 10-10 MG TBEC; 1 tab in AM, 1 tab mid afternoon 2 tabs at bedtime. Max dose 4 tabs daily.  Dispense: 100 tablet; Refill: 5   Preterm labor symptoms and general obstetric precautions including but not limited to vaginal bleeding, contractions, leaking of fluid and fetal movement were reviewed in detail with the patient. Please refer to After Visit Summary for other counseling recommendations.     Brock Bad, MD  01/12/2020

## 2020-01-16 ENCOUNTER — Other Ambulatory Visit: Payer: Self-pay | Admitting: Obstetrics

## 2020-01-16 DIAGNOSIS — A5901 Trichomonal vulvovaginitis: Secondary | ICD-10-CM

## 2020-01-16 DIAGNOSIS — B3731 Acute candidiasis of vulva and vagina: Secondary | ICD-10-CM

## 2020-01-16 DIAGNOSIS — B373 Candidiasis of vulva and vagina: Secondary | ICD-10-CM

## 2020-01-16 LAB — CERVICOVAGINAL ANCILLARY ONLY
Bacterial Vaginitis (gardnerella): NEGATIVE
Candida Glabrata: NEGATIVE
Candida Vaginitis: POSITIVE — AB
Chlamydia: NEGATIVE
Comment: NEGATIVE
Comment: NEGATIVE
Comment: NEGATIVE
Comment: NEGATIVE
Comment: NEGATIVE
Comment: NORMAL
Neisseria Gonorrhea: NEGATIVE
Trichomonas: POSITIVE — AB

## 2020-01-16 MED ORDER — FLUCONAZOLE 150 MG PO TABS: 150.0000 mg | ORAL_TABLET | Freq: Once | ORAL | 0 refills | Status: AC

## 2020-01-16 MED ORDER — METRONIDAZOLE 500 MG PO TABS: 500.0000 mg | ORAL_TABLET | Freq: Two times a day (BID) | ORAL | 2 refills | Status: DC

## 2020-01-18 ENCOUNTER — Telehealth (INDEPENDENT_AMBULATORY_CARE_PROVIDER_SITE_OTHER): Payer: Managed Care, Other (non HMO) | Admitting: Obstetrics & Gynecology

## 2020-01-18 ENCOUNTER — Encounter: Payer: Self-pay | Admitting: Obstetrics & Gynecology

## 2020-01-18 DIAGNOSIS — O0993 Supervision of high risk pregnancy, unspecified, third trimester: Secondary | ICD-10-CM

## 2020-01-18 DIAGNOSIS — Z98891 History of uterine scar from previous surgery: Secondary | ICD-10-CM

## 2020-01-18 DIAGNOSIS — O0992 Supervision of high risk pregnancy, unspecified, second trimester: Secondary | ICD-10-CM

## 2020-01-18 DIAGNOSIS — O210 Mild hyperemesis gravidarum: Secondary | ICD-10-CM

## 2020-01-18 DIAGNOSIS — O24410 Gestational diabetes mellitus in pregnancy, diet controlled: Secondary | ICD-10-CM

## 2020-01-18 DIAGNOSIS — Z8751 Personal history of pre-term labor: Secondary | ICD-10-CM

## 2020-01-18 DIAGNOSIS — Z3A33 33 weeks gestation of pregnancy: Secondary | ICD-10-CM

## 2020-01-18 NOTE — Progress Notes (Signed)
   TELEHEALTH VIRTUAL OBSTETRICS VISIT ENCOUNTER NOTE  I connected with Loretta Schmitt on 01/18/20 at  4:00 PM EDT by telephone at home and verified that I am speaking with the correct person using two identifiers.   I discussed the limitations, risks, security and privacy concerns of performing an evaluation and management service by telephone and the availability of in person appointments. I also discussed with the patient that there may be a patient responsible charge related to this service. The patient expressed understanding and agreed to proceed.  Subjective:  Loretta Schmitt is a 29 y.o. [phone] at [redacted]w[redacted]d being followed for ongoing prenatal care.  She is currently monitored for the following issues for this high-risk pregnancy and has Morning sickness; H/O: C-section; History of preterm delivery; Supervision of high risk pregnancy in second trimester; and Diet controlled gestational diabetes mellitus (GDM) in third trimester on their problem list.  Patient reports no complaints. Reports fetal movement. Denies any contractions, bleeding or leaking of fluid.   The following portions of the patient's history were reviewed and updated as appropriate: allergies, current medications, past family history, past medical history, past social history, past surgical history and problem list.   Objective:   General:  Alert, oriented and cooperative.   Mental Status: Normal mood and affect perceived. Normal judgment and thought content.  Rest of physical exam deferred due to type of encounter  Assessment and Plan:  Pregnancy: [id number] at [redacted]w[redacted]d 1. Diet controlled gestational diabetes mellitus (GDM) in third trimester Good control on diet only  2. Morning sickness resolved  3. History of preterm delivery PTB 26 weeks Previous CS wants repeat, sign consent next visit 4. Supervision of high risk pregnancy in second trimester BPP next week  Preterm labor symptoms and general obstetric precautions  including but not limited to vaginal bleeding, contractions, leaking of fluid and fetal movement were reviewed in detail with the patient.  I discussed the assessment and treatment plan with the patient. The patient was provided an opportunity to ask questions and all were answered. The patient agreed with the plan and demonstrated an understanding of the instructions. The patient was advised to call back or seek an in-person office evaluation/go to [facility] at [facility] for any urgent or concerning symptoms. Please refer to After Visit Summary for other counseling recommendations.   I provided 12 minutes of non-face-to-face time during this encounter.  Return in about 2 weeks (around 02/01/2020) for in person, sign VBAC form.  Future Appointments  Date Time Provider Department Center  01/27/2020  3:45 PM [facility] NURSE [facility]-US  01/27/2020  3:45 PM [facility] [place] 2 [facility]-US  02/07/2020  3:45 PM [facility] NURSE [facility]FC-US  02/07/2020  3:45 PM [facility] [place] 2 [facility] MFC-US  02/14/2020  3:45 PM [facility] NURSE WH-[facility] MFC-US  02/14/2020  3:45 PM [facility] [place] 1 [facility]-M[facility] MFC-US    Scheryl Darter, MD Center for [employer], [facility]

## 2020-01-18 NOTE — Progress Notes (Signed)
Virtual ROB  CC: pt notes calling after hours nurse due to intense pelvic pain.  Pt notes allergy sx's. Pt states sugars have been normal.  Pt unable to check B/P.

## 2020-01-18 NOTE — Patient Instructions (Signed)
Vaginal Birth After Cesarean Delivery  Vaginal birth after cesarean delivery (VBAC) is giving birth vaginally after previously delivering a baby through a cesarean section (C-section). A VBAC may be a safe option for you, depending on your health and other factors. It is important to discuss VBAC with your health care provider early in your pregnancy so you can understand the risks, benefits, and options. Having these discussions early will give you time to make your birth plan. Who are the best candidates for VBAC? The best candidates for VBAC are women who:  Have had one or two prior cesarean deliveries, and the incision made during the delivery was horizontal (low transverse).  Do not have a vertical (classical) scar on their uterus.  Have not had a tear in the wall of their uterus (uterine rupture).  Plan to have more pregnancies. A VBAC is also more likely to be successful:  In women who have previously given birth vaginally.  When labor starts by itself (spontaneously) before the due date. What are the benefits of VBAC? The benefits of delivering your baby vaginally instead of by a cesarean delivery include:  A shorter hospital stay.  A faster recovery time.  Less pain.  Avoiding risks associated with major surgery, such as infection and blood clots.  Less blood loss and less need for donated blood (transfusions). What are the risks of VBAC? The main risk of attempting a VBAC is that it may fail, forcing your health care provider to deliver your baby by a C-section. Other risks are rare and include:  Tearing (rupture) of the scar from a past cesarean delivery.  Other risks associated with vaginal deliveries. If a repeat cesarean delivery is needed, the risks include:  Blood loss.  Infection.  Blood clot.  Damage to surrounding organs.  Removal of the uterus (hysterectomy), if it is damaged.  Placenta problems in future pregnancies. What else should I know  about my options? Delivering a baby through a VBAC is similar to having a normal spontaneous vaginal delivery. Therefore, it is safe:  To try with twins.  For your health care provider to try to turn the baby from a breech position (external cephalic version) during labor.  With epidural analgesia for pain relief. Consider where you would like to deliver your baby. VBAC should be attempted in facilities where an emergency cesarean delivery can be performed. VBAC is not recommended for home births. Any changes in your health or your baby's health during your pregnancy may make it necessary to change your initial decision about VBAC. Your health care provider may recommend that you do not attempt a VBAC if:  Your baby's suspected weight is 8.8 lb (4 kg) or more.  You have preeclampsia. This is a condition that causes high blood pressure along with other symptoms, such as swelling and headaches.  You will have VBAC less than 19 months after your cesarean delivery.  You are past your due date.  You need to have labor started (induced) because your cervix is not ready for labor (unfavorable). Where to find more information  American Pregnancy Association: americanpregnancy.org  American Congress of Obstetricians and Gynecologists: acog.org Summary  Vaginal birth after cesarean delivery (VBAC) is giving birth vaginally after previously delivering a baby through a cesarean section (C-section). A VBAC may be a safe option for you, depending on your health and other factors.  Discuss VBAC with your health care provider early in your pregnancy so you can understand the risks, benefits, options, and   have plenty of time to make your birth plan.  The main risk of attempting a VBAC is that it may fail, forcing your health care provider to deliver your baby by a C-section. Other risks are rare. This information is not intended to replace advice given to you by your health care provider. Make sure  you discuss any questions you have with your health care provider. Document Revised: 01/18/2019 Document Reviewed: 12/30/2016 Elsevier Patient Education  2020 [employer]  

## 2020-01-27 ENCOUNTER — Ambulatory Visit (HOSPITAL_COMMUNITY)
Admission: RE | Admit: 2020-01-27 | Discharge: 2020-01-27 | Disposition: A | Discharge status: Home / Self Care | Payer: Managed Care, Other (non HMO) | Source: Ambulatory Visit | Attending: Obstetrics and Gynecology | Admitting: Obstetrics and Gynecology

## 2020-01-27 ENCOUNTER — Other Ambulatory Visit: Payer: Self-pay

## 2020-01-27 ENCOUNTER — Encounter (HOSPITAL_COMMUNITY): Payer: Self-pay

## 2020-01-27 ENCOUNTER — Ambulatory Visit (HOSPITAL_COMMUNITY): Payer: Managed Care, Other (non HMO) | Admitting: *Deleted

## 2020-01-27 DIAGNOSIS — O24410 Gestational diabetes mellitus in pregnancy, diet controlled: Secondary | ICD-10-CM

## 2020-01-27 DIAGNOSIS — Z3A34 34 weeks gestation of pregnancy: Secondary | ICD-10-CM

## 2020-01-27 DIAGNOSIS — O99213 Obesity complicating pregnancy, third trimester: Secondary | ICD-10-CM

## 2020-01-27 DIAGNOSIS — O09213 Supervision of pregnancy with history of pre-term labor, third trimester: Secondary | ICD-10-CM | POA: Diagnosis not present

## 2020-01-27 DIAGNOSIS — Z362 Encounter for other antenatal screening follow-up: Secondary | ICD-10-CM

## 2020-01-27 DIAGNOSIS — E669 Obesity, unspecified: Secondary | ICD-10-CM

## 2020-01-27 DIAGNOSIS — O34219 Maternal care for unspecified type scar from previous cesarean delivery: Secondary | ICD-10-CM

## 2020-01-27 DIAGNOSIS — O210 Mild hyperemesis gravidarum: Secondary | ICD-10-CM | POA: Insufficient documentation

## 2020-02-01 ENCOUNTER — Encounter: Payer: Managed Care, Other (non HMO) | Admitting: Obstetrics and Gynecology

## 2020-02-02 ENCOUNTER — Ambulatory Visit (INDEPENDENT_AMBULATORY_CARE_PROVIDER_SITE_OTHER): Payer: Managed Care, Other (non HMO) | Admitting: Obstetrics and Gynecology

## 2020-02-02 ENCOUNTER — Encounter: Payer: Self-pay | Admitting: Obstetrics and Gynecology

## 2020-02-02 ENCOUNTER — Other Ambulatory Visit: Payer: Self-pay

## 2020-02-02 VITALS — BP 105/72 | HR 106 | Wt 232.6 lb

## 2020-02-02 DIAGNOSIS — O24410 Gestational diabetes mellitus in pregnancy, diet controlled: Secondary | ICD-10-CM

## 2020-02-02 DIAGNOSIS — Z98891 History of uterine scar from previous surgery: Secondary | ICD-10-CM

## 2020-02-02 DIAGNOSIS — Z8751 Personal history of pre-term labor: Secondary | ICD-10-CM

## 2020-02-02 DIAGNOSIS — O0990 Supervision of high risk pregnancy, unspecified, unspecified trimester: Secondary | ICD-10-CM

## 2020-02-02 DIAGNOSIS — A599 Trichomoniasis, unspecified: Secondary | ICD-10-CM

## 2020-02-02 MED ORDER — METRONIDAZOLE 500 MG PO TABS: 500.0000 mg | ORAL_TABLET | Freq: Two times a day (BID) | ORAL | 0 refills | Status: DC

## 2020-02-02 NOTE — Progress Notes (Signed)
Pt presents for ROB requests rf on Albuterol  Pt does not have CBG readings today but reports WNL's

## 2020-02-02 NOTE — Patient Instructions (Signed)
Third Trimester of Pregnancy The third trimester is from week 28 through week 40 (months 7 through 9). The third trimester is a time when the unborn baby (fetus) is growing rapidly. At the end of the ninth month, the fetus is about 20 inches in length and weighs 6-10 pounds. Body changes during your third trimester Your body will continue to go through many changes during pregnancy. The changes vary from woman to woman. During the third trimester:  Your weight will continue to increase. You can expect to gain 25-35 pounds (11-16 kg) by the end of the pregnancy.  You may begin to get stretch marks on your hips, abdomen, and breasts.  You may urinate more often because the fetus is moving lower into your pelvis and pressing on your bladder.  You may develop or continue to have heartburn. This is caused by increased hormones that slow down muscles in the digestive tract.  You may develop or continue to have constipation because increased hormones slow digestion and cause the muscles that push waste through your intestines to relax.  You may develop hemorrhoids. These are swollen veins (varicose veins) in the rectum that can itch or be painful.  You may develop swollen, bulging veins (varicose veins) in your legs.  You may have increased body aches in the pelvis, back, or thighs. This is due to weight gain and increased hormones that are relaxing your joints.  You may have changes in your hair. These can include thickening of your hair, rapid growth, and changes in texture. Some women also have hair loss during or after pregnancy, or hair that feels dry or thin. Your hair will most likely return to normal after your baby is born.  Your breasts will continue to grow and they will continue to become tender. A yellow fluid (colostrum) may leak from your breasts. This is the first milk you are producing for your baby.  Your belly button may stick out.  You may notice more swelling in your hands,  face, or ankles.  You may have increased tingling or numbness in your hands, arms, and legs. The skin on your belly may also feel numb.  You may feel short of breath because of your expanding uterus.  You may have more problems sleeping. This can be caused by the size of your belly, increased need to urinate, and an increase in your body's metabolism.  You may notice the fetus "dropping," or moving lower in your abdomen (lightening).  You may have increased vaginal discharge.  You may notice your joints feel loose and you may have pain around your pelvic bone. What to expect at prenatal visits You will have prenatal exams every 2 weeks until week 36. Then you will have weekly prenatal exams. During a routine prenatal visit:  You will be weighed to make sure you and the baby are growing normally.  Your blood pressure will be taken.  Your abdomen will be measured to track your baby's growth.  The fetal heartbeat will be listened to.  Any test results from the previous visit will be discussed.  You may have a cervical check near your due date to see if your cervix has softened or thinned (effaced).  You will be tested for Group B streptococcus. This happens between 35 and 37 weeks. Your health care provider may ask you:  What your birth plan is.  How you are feeling.  If you are feeling the baby move.  If you have had any abnormal   symptoms, such as leaking fluid, bleeding, severe headaches, or abdominal cramping.  If you are using any tobacco products, including cigarettes, chewing tobacco, and electronic cigarettes.  If you have any questions. Other tests or screenings that may be performed during your third trimester include:  Blood tests that check for low iron levels (anemia).  Fetal testing to check the health, activity level, and growth of the fetus. Testing is done if you have certain medical conditions or if there are problems during the pregnancy.  Nonstress test  (NST). This test checks the health of your baby to make sure there are no signs of problems, such as the baby not getting enough oxygen. During this test, a belt is placed around your belly. The baby is made to move, and its heart rate is monitored during movement. What is false labor? False labor is a condition in which you feel small, irregular tightenings of the muscles in the womb (contractions) that usually go away with rest, changing position, or drinking water. These are called Braxton Hicks contractions. Contractions may last for hours, days, or even weeks before true labor sets in. If contractions come at regular intervals, become more frequent, increase in intensity, or become painful, you should see your health care provider. What are the signs of labor?  Abdominal cramps.  Regular contractions that start at 10 minutes apart and become stronger and more frequent with time.  Contractions that start on the top of the uterus and spread down to the lower abdomen and back.  Increased pelvic pressure and dull back pain.  A watery or bloody mucus discharge that comes from the vagina.  Leaking of amniotic fluid. This is also known as your "water breaking." It could be a slow trickle or a gush. Let your health care provider know if it has a color or strange odor. If you have any of these signs, call your health care provider right away, even if it is before your due date. Follow these instructions at home: Medicines  Follow your health care provider's instructions regarding medicine use. Specific medicines may be either safe or unsafe to take during pregnancy.  Take a prenatal vitamin that contains at least 600 micrograms (mcg) of folic acid.  If you develop constipation, try taking a stool softener if your health care provider approves. Eating and drinking   Eat a balanced diet that includes fresh fruits and vegetables, whole grains, good sources of protein such as meat, eggs, or tofu,  and low-fat dairy. Your health care provider will help you determine the amount of weight gain that is right for you.  Avoid raw meat and uncooked cheese. These carry germs that can cause birth defects in the baby.  If you have low calcium intake from food, talk to your health care provider about whether you should take a daily calcium supplement.  Eat four or five small meals rather than three large meals a day.  Limit foods that are high in fat and processed sugars, such as fried and sweet foods.  To prevent constipation: ? Drink enough fluid to keep your urine clear or pale yellow. ? Eat foods that are high in fiber, such as fresh fruits and vegetables, whole grains, and beans. Activity  Exercise only as directed by your health care provider. Most women can continue their usual exercise routine during pregnancy. Try to exercise for 30 minutes at least 5 days a week. Stop exercising if you experience uterine contractions.  Avoid heavy lifting.  Do   not exercise in extreme heat or humidity, or at high altitudes.  Wear low-heel, comfortable shoes.  Practice good posture.  You may continue to have sex unless your health care provider tells you otherwise. Relieving pain and discomfort  Take frequent breaks and rest with your legs elevated if you have leg cramps or low back pain.  Take warm sitz baths to soothe any pain or discomfort caused by hemorrhoids. Use hemorrhoid cream if your health care provider approves.  Wear a good support bra to prevent discomfort from breast tenderness.  If you develop varicose veins: ? Wear support pantyhose or compression stockings as told by your healthcare provider. ? Elevate your feet for 15 minutes, 3-4 times a day. Prenatal care  Write down your questions. Take them to your prenatal visits.  Keep all your prenatal visits as told by your health care provider. This is important. Safety  Wear your seat belt at all times when driving.  Make  a list of emergency phone numbers, including numbers for family, friends, the hospital, and police and fire departments. General instructions  Avoid cat litter boxes and soil used by cats. These carry germs that can cause birth defects in the baby. If you have a cat, ask someone to clean the litter box for you.  Do not travel far distances unless it is absolutely necessary and only with the approval of your health care provider.  Do not use hot tubs, steam rooms, or saunas.  Do not drink alcohol.  Do not use any products that contain nicotine or tobacco, such as cigarettes and e-cigarettes. If you need help quitting, ask your health care provider.  Do not use any medicinal herbs or unprescribed drugs. These chemicals affect the formation and growth of the baby.  Do not douche or use tampons or scented sanitary pads.  Do not cross your legs for long periods of time.  To prepare for the arrival of your baby: ? Take prenatal classes to understand, practice, and ask questions about labor and delivery. ? Make a trial run to the hospital. ? Visit the hospital and tour the maternity area. ? Arrange for maternity or paternity leave through employers. ? Arrange for family and friends to take care of pets while you are in the hospital. ? Purchase a rear-facing car seat and make sure you know how to install it in your car. ? Pack your hospital bag. ? Prepare the baby's nursery. Make sure to remove all pillows and stuffed animals from the baby's crib to prevent suffocation.  Visit your dentist if you have not gone during your pregnancy. Use a soft toothbrush to brush your teeth and be gentle when you floss. Contact a health care provider if:  You are unsure if you are in labor or if your water has broken.  You become dizzy.  You have mild pelvic cramps, pelvic pressure, or nagging pain in your abdominal area.  You have lower back pain.  You have persistent nausea, vomiting, or  diarrhea.  You have an unusual or bad smelling vaginal discharge.  You have pain when you urinate. Get help right away if:  Your water breaks before 37 weeks.  You have regular contractions less than 5 minutes apart before 37 weeks.  You have a fever.  You are leaking fluid from your vagina.  You have spotting or bleeding from your vagina.  You have severe abdominal pain or cramping.  You have rapid weight loss or weight gain.  You have   shortness of breath with chest pain.  You notice sudden or extreme swelling of your face, hands, ankles, feet, or legs.  Your baby makes fewer than 10 movements in 2 hours.  You have severe headaches that do not go away when you take medicine.  You have vision changes. Summary  The third trimester is from week 28 through week 40, months 7 through 9. The third trimester is a time when the unborn baby (fetus) is growing rapidly.  During the third trimester, your discomfort may increase as you and your baby continue to gain weight. You may have abdominal, leg, and back pain, sleeping problems, and an increased need to urinate.  During the third trimester your breasts will keep growing and they will continue to become tender. A yellow fluid (colostrum) may leak from your breasts. This is the first milk you are producing for your baby.  False labor is a condition in which you feel small, irregular tightenings of the muscles in the womb (contractions) that eventually go away. These are called Braxton Hicks contractions. Contractions may last for hours, days, or even weeks before true labor sets in.  Signs of labor can include: abdominal cramps; regular contractions that start at 10 minutes apart and become stronger and more frequent with time; watery or bloody mucus discharge that comes from the vagina; increased pelvic pressure and dull back pain; and leaking of amniotic fluid. This information is not intended to replace advice given to you by your  health care provider. Make sure you discuss any questions you have with your health care provider. Document Revised: 01/13/2019 Document Reviewed: 10/28/2016 Elsevier Patient Education  2020 [employer]  

## 2020-02-02 NOTE — Progress Notes (Signed)
Subjective:  Loretta Schmitt is a 29 y.o. [phone] at [redacted]w[redacted]d being seen today for ongoing prenatal care.  She is currently monitored for the following issues for this high-risk pregnancy and has Morning sickness; H/O: C-section; History of preterm delivery; Supervision of high risk pregnancy, antepartum; and Diet controlled gestational diabetes mellitus (GDM) in third trimester on their problem list.  Patient reports general discomforts of pregnancy.  Contractions: Not present. Vag. Bleeding: None.  Movement: Present. Denies leaking of fluid.   The following portions of the patient's history were reviewed and updated as appropriate: allergies, current medications, past family history, past medical history, past social history, past surgical history and problem list. Problem list updated.  Objective:   Vitals:   02/02/20 1623  BP: 105/72  Pulse: (!) 106  Weight: 232 lb 9.6 oz (105.5 kg)    Fetal Status:     Movement: Present     General:  Alert, oriented and cooperative. Patient is in no acute distress.  Skin: Skin is warm and dry. No rash noted.   Cardiovascular: Normal heart rate noted  Respiratory: Normal respiratory effort, no problems with respiration noted  Abdomen: Soft, gravid, appropriate for gestational age. Pain/Pressure: Absent     Pelvic:  Cervical exam deferred        Extremities: Normal range of motion.  Edema: Trace  Mental Status: Normal mood and affect. Normal behavior. Normal judgment and thought content.   Urinalysis:      Assessment and Plan:  Pregnancy: [id number] at [redacted]w[redacted]d  1. Supervision of high risk pregnancy, antepartum Stable GBS and vaginal cultures next visit  2. History of preterm delivery Stable Pt gives herself on Wednesday, last injection this week.  3. H/O: C-section Desires repeat  4. Diet controlled gestational diabetes mellitus (GDM) in third trimester Uses paper log. Did not bring readings but reports readings are in goal range Pt instructed  to bring readings to all appts Growth 93 % on 01/27/20 BPP 8/8 then as well Continue with weekly antenatal testing  5. Trichimoniasis Pt informed of results form 01/12/20 test. Advised to have partner seen and treated and to reframe from intercourse Rx for Flagyl sent to pharmacy  - metroNIDAZOLE (FLAGYL) 500 MG tablet; Take 1 tablet (500 mg total) by mouth 2 (two) times daily.  Dispense: 14 tablet; Refill: 0  Preterm labor symptoms and general obstetric precautions including but not limited to vaginal bleeding, contractions, leaking of fluid and fetal movement were reviewed in detail with the patient. Please refer to After Visit Summary for other counseling recommendations.  Return in about 1 week (around 02/09/2020) for OB visit, face to face, MD provider.   Hermina Staggers, MD

## 2020-02-07 ENCOUNTER — Ambulatory Visit (HOSPITAL_COMMUNITY): Payer: Managed Care, Other (non HMO) | Attending: Obstetrics and Gynecology

## 2020-02-07 ENCOUNTER — Ambulatory Visit: Payer: Managed Care, Other (non HMO) | Admitting: *Deleted

## 2020-02-07 ENCOUNTER — Other Ambulatory Visit: Payer: Self-pay

## 2020-02-07 DIAGNOSIS — O210 Mild hyperemesis gravidarum: Secondary | ICD-10-CM

## 2020-02-07 DIAGNOSIS — Z3A35 35 weeks gestation of pregnancy: Secondary | ICD-10-CM | POA: Diagnosis not present

## 2020-02-07 DIAGNOSIS — O24410 Gestational diabetes mellitus in pregnancy, diet controlled: Secondary | ICD-10-CM

## 2020-02-08 ENCOUNTER — Other Ambulatory Visit: Payer: Self-pay | Admitting: *Deleted

## 2020-02-08 DIAGNOSIS — O09899 Supervision of other high risk pregnancies, unspecified trimester: Secondary | ICD-10-CM

## 2020-02-09 ENCOUNTER — Other Ambulatory Visit (HOSPITAL_COMMUNITY)
Admission: RE | Admit: 2020-02-09 | Discharge: 2020-02-09 | Disposition: A | Discharge status: Home / Self Care | Payer: Managed Care, Other (non HMO) | Source: Ambulatory Visit | Attending: Obstetrics & Gynecology | Admitting: Obstetrics & Gynecology

## 2020-02-09 ENCOUNTER — Other Ambulatory Visit: Payer: Self-pay

## 2020-02-09 ENCOUNTER — Ambulatory Visit (INDEPENDENT_AMBULATORY_CARE_PROVIDER_SITE_OTHER): Payer: Managed Care, Other (non HMO) | Admitting: Obstetrics & Gynecology

## 2020-02-09 VITALS — BP 118/76 | HR 112 | Wt 236.9 lb

## 2020-02-09 DIAGNOSIS — O0990 Supervision of high risk pregnancy, unspecified, unspecified trimester: Secondary | ICD-10-CM | POA: Insufficient documentation

## 2020-02-09 DIAGNOSIS — O24410 Gestational diabetes mellitus in pregnancy, diet controlled: Secondary | ICD-10-CM

## 2020-02-09 DIAGNOSIS — Z98891 History of uterine scar from previous surgery: Secondary | ICD-10-CM

## 2020-02-09 DIAGNOSIS — Z8751 Personal history of pre-term labor: Secondary | ICD-10-CM

## 2020-02-09 NOTE — Progress Notes (Signed)
   PRENATAL VISIT NOTE  Subjective:  Consepcion Schmitt is a 29 y.o. [phone] at [redacted]w[redacted]d being seen today for ongoing prenatal care.  She is currently monitored for the following issues for this high-risk pregnancy and has Morning sickness; H/O: C-section; History of preterm delivery; Supervision of high risk pregnancy, antepartum; and Diet controlled gestational diabetes mellitus (GDM) in third trimester on their problem list.  Patient reports pressure.  Contractions: Irregular. Vag. Bleeding: None.  Movement: Present. Denies leaking of fluid.   The following portions of the patient's history were reviewed and updated as appropriate: allergies, current medications, past family history, past medical history, past social history, past surgical history and problem list.   Objective:   Vitals:   02/09/20 1444  BP: 118/76  Pulse: (!) 112  Weight: 236 lb 14.4 oz (107.5 kg)    Fetal Status: Fetal Heart Rate (bpm): 154   Movement: Present  Presentation: Vertex  General:  Alert, oriented and cooperative. Patient is in no acute distress.  Skin: Skin is warm and dry. No rash noted.   Cardiovascular: Normal heart rate noted  Respiratory: Normal respiratory effort, no problems with respiration noted  Abdomen: Soft, gravid, appropriate for gestational age.  Pain/Pressure: Present     Pelvic: Cervical exam performed in the presence of a chaperone Dilation: Fingertip Effacement (%): 50 Station: -3  Extremities: Normal range of motion.     Mental Status: Normal mood and affect. Normal behavior. Normal judgment and thought content.   Assessment and Plan:  Pregnancy: [id number] at [redacted]w[redacted]d 1. Diet controlled gestational diabetes mellitus (GDM) in third trimester Good control with diet  2. History of preterm delivery   3. Supervision of high risk pregnancy, antepartum Good fetal surveillance and growth  4. H/O: C-section Elective repeat, requests 39.2 due to schedule conflict  Preterm labor symptoms and  general obstetric precautions including but not limited to vaginal bleeding, contractions, leaking of fluid and fetal movement were reviewed in detail with the patient. Please refer to After Visit Summary for other counseling recommendations.   Return in about 1 week (around 02/16/2020).  Future Appointments  Date Time Provider Department Center  02/14/2020  3:45 PM [facility] NURSE [facility]  02/14/2020  3:45 PM [facility] US1 [facility]  02/20/2020  4:00 PM Adam Phenix, MD [facility] None  02/21/2020  3:45 PM [facility] NURSE [facility]  02/21/2020  3:45 PM [facility] US4 [facility]    Scheryl Darter, MD

## 2020-02-09 NOTE — Progress Notes (Signed)
Patient reports fetal movement with some irregular contractions. Pt reports last BG reading today was 91.

## 2020-02-09 NOTE — Patient Instructions (Signed)
Cesarean Delivery Cesarean birth, or cesarean delivery, is the surgical delivery of a baby through an incision in the abdomen and the uterus. This may be referred to as a C-section. This procedure may be scheduled ahead of time, or it may be done in an emergency situation. Tell a health care provider about:  Any allergies you have.  All medicines you are taking, including vitamins, herbs, eye drops, creams, and over-the-counter medicines.  Any problems you or family members have had with anesthetic medicines.  Any blood disorders you have.  Any surgeries you have had.  Any medical conditions you have.  Whether you or any members of your family have a history of deep vein thrombosis (DVT) or pulmonary embolism (PE). What are the risks? Generally, this is a safe procedure. However, problems may occur, including:  Infection.  Bleeding.  Allergic reactions to medicines.  Damage to other structures or organs.  Blood clots.  Injury to your baby. What happens before the procedure? General instructions  Follow instructions from your health care provider about eating or drinking restrictions.  If you know that you are going to have a cesarean delivery, do not shave your pubic area. Shaving before the procedure may increase your risk of infection.  Plan to have someone take you home from the hospital.  Ask your health care provider what steps will be taken to prevent infection. These may include: ? Removing hair at the surgery site. ? Washing skin with a germ-killing soap. ? Taking antibiotic medicine.  Depending on the reason for your cesarean delivery, you may have a physical exam or additional testing, such as an ultrasound.  You may have your blood or urine tested. Questions for your health care provider  Ask your health care provider about: ? Changing or stopping your regular medicines. This is especially important if you are taking diabetes medicines or blood  thinners. ? Your pain management plan. This is especially important if you plan to breastfeed your baby. ? How long you will be in the hospital after the procedure. ? Any concerns you may have about receiving blood products, if you need them during the procedure. ? Cord blood banking, if you plan to collect your baby's umbilical cord blood.  You may also want to ask your health care provider: ? Whether you will be able to hold or breastfeed your baby while you are still in the operating room. ? Whether your baby can stay with you immediately after the procedure and during your recovery. ? Whether a family member or a person of your choice can go with you into the operating room and stay with you during the procedure, immediately after the procedure, and during your recovery. What happens during the procedure?   An IV will be inserted into one of your veins.  Fluid and medicines, such as antibiotics, will be given before the surgery.  Fetal monitors will be placed on your abdomen to check your baby's heart rate.  You may be given a special warming gown to wear to keep your temperature stable.  A catheter may be inserted into your bladder through your urethra. This drains your urine during the procedure.  You may be given one or more of the following: ? A medicine to numb the area (local anesthetic). ? A medicine to make you fall asleep (general anesthetic). ? A medicine (regional anesthetic) that is injected into your back or through a small thin tube placed in your back (spinal anesthetic or epidural anesthetic).   This numbs everything below the injection site and allows you to stay awake during your procedure. If this makes you feel nauseous, tell your health care provider. Medicines will be available to help reduce any nausea you may feel.  An incision will be made in your abdomen, and then in your uterus.  If you are awake during your procedure, you may feel tugging and pulling in  your abdomen, but you should not feel pain. If you feel pain, tell your health care provider immediately.  Your baby will be removed from your uterus. You may feel more pressure or pushing while this happens.  Immediately after birth, your baby will be dried and kept warm. You may be able to hold and breastfeed your baby.  The umbilical cord may be clamped and cut during this time. This usually occurs after waiting a period of 1-2 minutes after delivery.  Your placenta will be removed from your uterus.  Your incisions will be closed with stitches (sutures). Staples, skin glue, or adhesive strips may also be applied to the incision in your abdomen.  Bandages (dressings) may be placed over the incision in your abdomen. The procedure may vary among health care providers and hospitals. What happens after the procedure?  Your blood pressure, heart rate, breathing rate, and blood oxygen level will be monitored until you are discharged from the hospital.  You may continue to receive fluids and medicines through an IV.  You will have some pain. Medicines will be available to help control your pain.  To help prevent blood clots: ? You may be given medicines. ? You may have to wear compression stockings or devices. ? You will be encouraged to walk around when you are able.  Hospital staff will encourage and support bonding with your baby. Your hospital may have you and your baby to stay in the same room (rooming in) during your hospital stay to encourage successful bonding and breastfeeding.  You may be encouraged to cough and breathe deeply often. This helps to prevent lung problems.  If you have a catheter draining your urine, it will be removed as soon as possible after your procedure. Summary  Cesarean birth, or cesarean delivery, is the surgical delivery of a baby through an incision in the abdomen and the uterus.  Follow instructions from your health care provider about eating or  drinking restrictions before the procedure.  You will have some pain after the procedure. Medicines will be available to help control your pain.  Hospital staff will encourage and support bonding with your baby after the procedure. Your hospital may have you and your baby to stay in the same room (rooming in) during your hospital stay to encourage successful bonding and breastfeeding. This information is not intended to replace advice given to you by your health care provider. Make sure you discuss any questions you have with your health care provider. Document Revised: 03/29/2018 Document Reviewed: 03/29/2018 Elsevier Patient Education  2020 [employer]  

## 2020-02-10 LAB — CERVICOVAGINAL ANCILLARY ONLY
Chlamydia: NEGATIVE
Comment: NEGATIVE
Comment: NEGATIVE
Comment: NORMAL
Neisseria Gonorrhea: NEGATIVE
Trichomonas: POSITIVE — AB

## 2020-02-11 LAB — STREP GP B NAA: Strep Gp B NAA: NEGATIVE

## 2020-02-12 ENCOUNTER — Other Ambulatory Visit: Payer: Self-pay | Admitting: Obstetrics & Gynecology

## 2020-02-12 DIAGNOSIS — A599 Trichomoniasis, unspecified: Secondary | ICD-10-CM

## 2020-02-12 MED ORDER — METRONIDAZOLE 500 MG PO TABS: ORAL_TABLET | ORAL | 0 refills | Status: DC

## 2020-02-12 NOTE — Progress Notes (Signed)
Meds ordered this encounter  Medications  . metroNIDAZOLE (FLAGYL) 500 MG tablet    Sig: Take two tablets by mouth twice a day, for one day.  Or you can take all four tablets at once if you can tolerate it.    Dispense:  4 tablet    Refill:  0    

## 2020-02-12 NOTE — Progress Notes (Signed)
Flagyl prescribed for trichomonas

## 2020-02-14 ENCOUNTER — Inpatient Hospital Stay (HOSPITAL_COMMUNITY)
Admission: AD | Admit: 2020-02-14 | Discharge: 2020-02-17 | DRG: 788 | Disposition: A | Discharge status: Home / Self Care | Payer: Managed Care, Other (non HMO) | Attending: Family Medicine | Admitting: Family Medicine

## 2020-02-14 ENCOUNTER — Ambulatory Visit (HOSPITAL_BASED_OUTPATIENT_CLINIC_OR_DEPARTMENT_OTHER): Payer: Managed Care, Other (non HMO)

## 2020-02-14 ENCOUNTER — Ambulatory Visit: Payer: Managed Care, Other (non HMO) | Admitting: *Deleted

## 2020-02-14 ENCOUNTER — Encounter (HOSPITAL_COMMUNITY): Payer: Self-pay | Admitting: Family Medicine

## 2020-02-14 ENCOUNTER — Other Ambulatory Visit: Payer: Self-pay

## 2020-02-14 DIAGNOSIS — Z87891 Personal history of nicotine dependence: Secondary | ICD-10-CM

## 2020-02-14 DIAGNOSIS — O210 Mild hyperemesis gravidarum: Secondary | ICD-10-CM

## 2020-02-14 DIAGNOSIS — Z8751 Personal history of pre-term labor: Secondary | ICD-10-CM

## 2020-02-14 DIAGNOSIS — O34211 Maternal care for low transverse scar from previous cesarean delivery: Secondary | ICD-10-CM | POA: Diagnosis present

## 2020-02-14 DIAGNOSIS — O9952 Diseases of the respiratory system complicating childbirth: Secondary | ICD-10-CM | POA: Diagnosis present

## 2020-02-14 DIAGNOSIS — O368130 Decreased fetal movements, third trimester, not applicable or unspecified: Principal | ICD-10-CM | POA: Diagnosis present

## 2020-02-14 DIAGNOSIS — O288 Other abnormal findings on antenatal screening of mother: Secondary | ICD-10-CM | POA: Diagnosis present

## 2020-02-14 DIAGNOSIS — Z20822 Contact with and (suspected) exposure to covid-19: Secondary | ICD-10-CM | POA: Diagnosis present

## 2020-02-14 DIAGNOSIS — J45909 Unspecified asthma, uncomplicated: Secondary | ICD-10-CM | POA: Diagnosis present

## 2020-02-14 DIAGNOSIS — O24410 Gestational diabetes mellitus in pregnancy, diet controlled: Secondary | ICD-10-CM

## 2020-02-14 DIAGNOSIS — Z98891 History of uterine scar from previous surgery: Secondary | ICD-10-CM

## 2020-02-14 DIAGNOSIS — Z3A37 37 weeks gestation of pregnancy: Secondary | ICD-10-CM

## 2020-02-14 DIAGNOSIS — O134 Gestational [pregnancy-induced] hypertension without significant proteinuria, complicating childbirth: Secondary | ICD-10-CM | POA: Diagnosis present

## 2020-02-14 DIAGNOSIS — O368330 Maternal care for abnormalities of the fetal heart rate or rhythm, third trimester, not applicable or unspecified: Secondary | ICD-10-CM

## 2020-02-14 DIAGNOSIS — Z3A36 36 weeks gestation of pregnancy: Secondary | ICD-10-CM | POA: Diagnosis not present

## 2020-02-14 DIAGNOSIS — O99214 Obesity complicating childbirth: Secondary | ICD-10-CM | POA: Diagnosis present

## 2020-02-14 DIAGNOSIS — O24420 Gestational diabetes mellitus in childbirth, diet controlled: Secondary | ICD-10-CM | POA: Diagnosis present

## 2020-02-14 DIAGNOSIS — N858 Other specified noninflammatory disorders of uterus: Secondary | ICD-10-CM | POA: Diagnosis not present

## 2020-02-14 LAB — COMPREHENSIVE METABOLIC PANEL
ALT: 23 U/L (ref 0–44)
AST: 22 U/L (ref 15–41)
Albumin: 2.8 g/dL — ABNORMAL LOW (ref 3.5–5.0)
Alkaline Phosphatase: 132 U/L — ABNORMAL HIGH (ref 38–126)
Anion gap: 7 (ref 5–15)
BUN: 7 mg/dL (ref 6–20)
CO2: 21 mmol/L — ABNORMAL LOW (ref 22–32)
Calcium: 8.7 mg/dL — ABNORMAL LOW (ref 8.9–10.3)
Chloride: 108 mmol/L (ref 98–111)
Creatinine, Ser: 0.6 mg/dL (ref 0.44–1.00)
GFR calc Af Amer: >60 mL/min (ref >60)
GFR calc non Af Amer: >60 mL/min (ref >60)
Glucose, Bld: 83 mg/dL (ref 70–99)
Potassium: 3.9 mmol/L (ref 3.5–5.1)
Sodium: 136 mmol/L (ref 135–145)
Total Bilirubin: 0.5 mg/dL (ref 0.3–1.2)
Total Protein: 6.3 g/dL — ABNORMAL LOW (ref 6.5–8.1)

## 2020-02-14 LAB — CBC
HCT: 31.3 % — ABNORMAL LOW (ref 36.0–46.0)
Hemoglobin: 10.2 g/dL — ABNORMAL LOW (ref 12.0–15.0)
MCH: 29.1 pg (ref 26.0–34.0)
MCHC: 32.6 g/dL (ref 30.0–36.0)
MCV: 89.4 fL (ref 80.0–100.0)
Platelets: 261 10*3/uL (ref 150–400)
RBC: 3.5 MIL/uL — ABNORMAL LOW (ref 3.87–5.11)
RDW: 13 % (ref 11.5–15.5)
WBC: 7.7 10*3/uL (ref 4.0–10.5)
nRBC: 0 % (ref 0.0–0.2)

## 2020-02-14 LAB — PROTEIN / CREATININE RATIO, URINE
Creatinine, Urine: 265.57 mg/dL
Protein Creatinine Ratio: 0.09 mg/mg{Cre} (ref 0.00–0.15)
Total Protein, Urine: 24 mg/dL

## 2020-02-14 LAB — ABO/RH: ABO/RH(D): B POS

## 2020-02-14 LAB — GLUCOSE, CAPILLARY: Glucose-Capillary: 110 mg/dL — ABNORMAL HIGH (ref 70–99)

## 2020-02-14 LAB — SARS CORONAVIRUS 2 BY RT PCR (HOSPITAL ORDER, PERFORMED IN ~~LOC~~ HOSPITAL LAB): SARS Coronavirus 2: NEGATIVE

## 2020-02-14 LAB — TYPE AND SCREEN
ABO/RH(D): B POS
Antibody Screen: NEGATIVE

## 2020-02-14 MED ORDER — CALCIUM CARBONATE ANTACID 500 MG PO CHEW: 2.0000 | CHEWABLE_TABLET | ORAL | Status: DC | PRN

## 2020-02-14 MED ORDER — LACTATED RINGERS IV BOLUS
1000.0000 mL | Freq: Once | INTRAVENOUS | Status: AC
  Administered 2020-02-14: 1000 mL via INTRAVENOUS

## 2020-02-14 MED ORDER — INSULIN ASPART 100 UNIT/ML ~~LOC~~ SOLN
0.0000 [IU] | Freq: Three times a day (TID) | SUBCUTANEOUS | Status: DC
  Administered 2020-02-14: 2 [IU] via SUBCUTANEOUS

## 2020-02-14 MED ORDER — ACETAMINOPHEN 325 MG PO TABS: 650.0000 mg | ORAL_TABLET | ORAL | Status: DC | PRN

## 2020-02-14 MED ORDER — ZOLPIDEM TARTRATE 5 MG PO TABS
5.0000 mg | ORAL_TABLET | Freq: Every evening | ORAL | Status: DC | PRN
  Administered 2020-02-14: 5 mg via ORAL
  Filled 2020-02-14: qty 1

## 2020-02-14 MED ORDER — PRENATAL MULTIVITAMIN CH: 1.0000 | ORAL_TABLET | Freq: Every day | ORAL | Status: DC

## 2020-02-14 MED ORDER — DOCUSATE SODIUM 100 MG PO CAPS: 100.0000 mg | ORAL_CAPSULE | Freq: Every day | ORAL | Status: DC

## 2020-02-14 NOTE — Plan of Care (Signed)
  Problem: Education: Goal: Knowledge of General Education information will improve Description: Including pain rating scale, medication(s)/side effects and non-pharmacologic comfort measures Outcome: Completed/Met   Problem: Nutrition: Goal: Adequate nutrition will be maintained Outcome: Completed/Met   Problem: Coping: Goal: Level of anxiety will decrease Outcome: Completed/Met   Problem: Elimination: Goal: Will not experience complications related to urinary retention Outcome: Completed/Met   

## 2020-02-14 NOTE — Progress Notes (Unsigned)
MFM Note  This patient was seen for a biophysical profile due to poorly controlled gestational diabetes.    A biophysical profile performed today was 4 out of 8.  She received a -2 for absent fetal tone and another -2 for absent fetal movement.  Fetal tachycardia with a heart rate in the 170s to 180s was noted throughout today's exam.  There was normal amniotic fluid noted today.  Due to today's ultrasound findings, the patient was sent to the MAU for evaluation immediately following today's ultrasound exam.    At her current gestational age, I would have a low threshold for delivery should the fetal status be nonreassuring or should there be persistent fetal tachycardia noted on the fetal heart rate monitor.

## 2020-02-14 NOTE — MAU Note (Signed)
Sent in from MFM. ([facility] today 4/8) Pt states decreased fetal movement, no other complaints.

## 2020-02-14 NOTE — H&P (Addendum)
FACULTY PRACTICE ANTEPARTUM ADMISSION HISTORY AND PHYSICAL NOTE   History of Present Illness: Loretta Schmitt is a 29 y.o. [id number] [facility], decreased fetal movement, gestational hypertension and gestational diabetes. Patient reports the fetal movement as decreased . Patient reports uterine contraction  activity as none. Patient reports  vaginal bleeding as none. Patient describes fluid per vagina as None. Fetal presentation is cephalic.  Patient Active Problem List   Diagnosis Date Noted   Diet controlled gestational diabetes mellitus (GDM) in third trimester 12/01/2019   Supervision of high risk pregnancy, antepartum 09/20/2019   H/O: C-section 09/12/2019   History of preterm delivery 09/12/2019   Morning sickness 07/25/2019    Past Medical History:  Diagnosis Date   Anemia    Asthma    Scoliosis     Past Surgical History:  Procedure Laterality Date   CESAREAN SECTION      OB History  Gravida Para Term Preterm AB Living  _0 SAB TAB Ectopic Multiple Live Births    1     1    # Outcome Date GA Lbr Len/2nd Weight Sex Delivery Anes PTL Lv  3 Current           2 TAB 01/05/15          1 Preterm 02/26/14 286w0d M CS-LTranv   LIV    Social History   Socioeconomic History   Marital status: Single    Spouse name: Not on file   Number of children: Not on file   Years of education: Not on file   Highest education level: Not on file  Occupational History   Not on file  Tobacco Use   Smoking status: Former Smoker    Quit date: 10/06/2012    Years since quitting: 7.3   Smokeless tobacco: Never Used  Substance and Sexual Activity   Alcohol use: No   Drug use: No   Sexual activity: Yes    Birth control/protection: None  Other Topics Concern   Not on file  Social History Narrative   Not on file   Social Determinants of Health   Financial Resource Strain:    Difficulty of Paying Living Expenses:   Food Insecurity:    Worried About  RuCharity fundraisern the Last Year:    RaArboriculturistn the Last Year:   Transportation Needs:    LaFilm/video editorMedical):    Lack of Transportation (Non-Medical):   Physical Activity:    Days of Exercise per Week:    Minutes of Exercise per Session:   Stress:    Feeling of Stress :   Social Connections:    Frequency of Communication with Friends and Family:    Frequency of Social Gatherings with Friends and Family:    Attends Religious Services:    Active Member of Clubs or Organizations:    Attends ClArchivisteetings:    Marital Status:     No family history on file.  No Known Allergies  Facility-Administered Medications Prior to Admission  Medication Dose Route Frequency Provider Last Rate Last Admin   HYDROXYprogesterone Caproate SOAJ 275 mg  275 mg Subcutaneous Weekly HaLavonia DraftsMD   275 mg at 10/05/19 1636   Medications Prior to Admission  Medication Sig Dispense Refill Last Dose   albuterol (VENTOLIN HFA) 108 (90 Base) MCG/ACT inhaler Inhale 2 puffs into the lungs every 6 (six) hours  as needed for wheezing or shortness of breath. 6.7 g 0 Past Month at Unknown time   Blood Glucose Monitoring Suppl (ACCU-CHEK GUIDE) w/Device KIT 1 Device by Does not apply route 4 (four) times daily. 1 kit 0 02/14/2020 at Unknown time   Doxylamine-Pyridoxine (DICLEGIS) 10-10 MG TBEC 1 tab in AM, 1 tab mid afternoon 2 tabs at bedtime. Max dose 4 tabs daily. 100 tablet 5 Past Week at Unknown time   prenatal vitamin w/FE, FA (NATACHEW) 29-1 MG CHEW chewable tablet Chew 1 tablet by mouth daily at 12 noon. 30 tablet 0 02/14/2020 at Unknown time   scopolamine (TRANSDERM-SCOP) 1 MG/3DAYS Place 1 patch (1.5 mg total) onto the skin every 3 (three) days. 10 patch 1 Past Week at Unknown time   Accu-Chek Softclix Lancets lancets Use as instructed 100 each 12    Blood Pressure Monitoring (BLOOD PRESSURE KIT) DEVI 1 kit by Does not apply route as needed. 1 each 0     glucose blood (ACCU-CHEK GUIDE) test strip Use to check blood sugars four times a day was instructed 50 each 12    metroNIDAZOLE (FLAGYL) 500 MG tablet Take 1 tablet (500 mg total) by mouth 2 (two) times daily. (Patient not taking: Reported on 02/09/2020) 14 tablet 0    metroNIDAZOLE (FLAGYL) 500 MG tablet Take two tablets by mouth twice a day, for one day.  Or you can take all four tablets at once if you can tolerate it. 4 tablet 0    omeprazole (PRILOSEC) 20 MG capsule Take 1 capsule (20 mg total) by mouth daily. (Patient not taking: Reported on 01/05/2020) 30 capsule 2     Review of Systems  Eyes: Negative for photophobia and visual disturbance.  Respiratory: Negative for shortness of breath.   Cardiovascular: Negative for palpitations.  Gastrointestinal: Negative for abdominal pain.  Genitourinary: Negative for dysuria, vaginal bleeding and vaginal discharge.  Musculoskeletal: Negative for back pain.  Neurological: Negative for syncope, weakness and headaches.  All other systems reviewed and are negative.  Vitals:   Patient Vitals for the past 24 hrs:  BP Temp Temp src Pulse Resp SpO2 Height Weight  02/14/20 2017 (!) 147/88 -- -- (!) 101 -- -- -- --  02/14/20 2016 -- -- -- -- -- 99 % -- --  02/14/20 2011 -- -- -- -- -- 99 % -- --  02/14/20 2006 -- -- -- -- -- 99 % -- --  02/14/20 2001 (!) 145/73 -- -- 94 -- 99 % -- --  02/14/20 1956 -- -- -- -- -- 99 % -- --  02/14/20 1951 -- -- -- -- -- 99 % -- --  02/14/20 1947 137/73 -- -- 98 -- -- -- --  02/14/20 1946 -- -- -- -- -- 99 % -- --  02/14/20 1941 -- -- -- -- -- 98 % -- --  02/14/20 1936 -- -- -- -- -- 98 % -- --  02/14/20 1930 (!) 144/87 -- -- 97 -- -- -- --  02/14/20 1837 (!) 145/81 -- -- 99 -- -- -- --  02/14/20 1813 (!) 143/86 -- -- (!) 102 -- -- -- --  02/14/20 1732 131/80 98.4 F (36.9 C) Oral (!) 103 20 100 % 5' 4" (1.626 m) 108.1 kg    Physical Examination: Nursing note and vitals reviewed. Constitutional: She is oriented  to person, place, and time. She appears well-developed and well-nourished.  Cardiovascular: Normal rate and normal heart sounds.  Respiratory: Effort normal and breath sounds normal.  GI: She exhibits no distension. There is no abdominal tenderness. There is no rebound and no guarding.  Gravid  Neurological: She is alert and oriented to person, place, and time.  Skin: Skin is warm and dry.  Psychiatric: She has a normal mood and affect. Her behavior is normal. Judgment and thought content normal.    Labs:  Results for orders placed or performed during the hospital encounter of 02/14/20 (from the past 24 hour(s))  SARS Coronavirus 2 by RT PCR (hospital order, performed in [place] hospital lab) Nasopharyngeal Nasopharyngeal Swab   Collection Time: 02/14/20  6:07 PM   Specimen: Nasopharyngeal Swab  Result Value Ref Range   SARS Coronavirus 2 NEGATIVE NEGATIVE  CBC   Collection Time: 02/14/20  6:30 PM  Result Value Ref Range   WBC 7.7 4.0 - 10.5 K/uL   RBC 3.50 (L) 3.87 - 5.11 MIL/uL   Hemoglobin 10.2 (L) 12.0 - 15.0 g/dL   HCT 31.3 (L) 36.0 - 46.0 %   MCV 89.4 80.0 - 100.0 fL   MCH 29.1 26.0 - 34.0 pg   MCHC 32.6 30.0 - 36.0 g/dL   RDW 13.0 11.5 - 15.5 %   Platelets 261 150 - 400 K/uL   nRBC 0.0 0.0 - 0.2 %  Type and screen [place]   Collection Time: 02/14/20  6:38 PM  Result Value Ref Range   ABO/RH(D) B POS    Antibody Screen NEG    Sample Expiration      02/17/2020,2359 Performed at [facility], [place] [address], [place], [place] [address]   ABO/Rh   Collection Time: 02/14/20  6:38 PM  Result Value Ref Range   ABO/RH(D)      B POS Performed at [place] [address] [place], [place] [address]   Protein / creatinine ratio, urine   Collection Time: 02/14/20  6:55 PM  Result Value Ref Range   Creatinine, Urine 265.57 mg/dL   Total Protein, Urine 24 mg/dL   Protein Creatinine Ratio 0.09 0.00 - 0.15 mg/mg[Cre]  Comprehensive  metabolic panel   Collection Time: 02/14/20  6:55 PM  Result Value Ref Range   Sodium 136 135 - 145 mmol/L   Potassium 3.9 3.5 - 5.1 mmol/L   Chloride 108 98 - 111 mmol/L   CO2 21 (L) 22 - 32 mmol/L   Glucose, Bld 83 70 - 99 mg/dL   BUN 7 6 - 20 mg/dL   Creatinine, Ser 0.60 0.44 - 1.00 mg/dL   Calcium 8.7 (L) 8.9 - 10.3 mg/dL   Total Protein 6.3 (L) 6.5 - 8.1 g/dL   Albumin 2.8 (L) 3.5 - 5.0 g/dL   AST 22 15 - 41 U/L   ALT 23 0 - 44 U/L   Alkaline Phosphatase 132 (H) 38 - 126 U/L   Total Bilirubin 0.5 0.3 - 1.2 mg/dL   GFR calc non Af Amer >60 >60 mL/min   GFR calc Af Amer >60 >60 mL/min   Anion gap 7 5 - 15    Imaging Studies: [place] MFM FETAL BPP WO NON STRESS  Result Date: 02/14/2020 ----------------------------------------------------------------------  OBSTETRICS REPORT                       (Signed Final 02/14/2020 05:17 pm) ---------------------------------------------------------------------- Patient Info  ID #:       [id number]  D.O.B.:  [birth year] (29 yrs)  Name:       Loretta Schmitt                  Visit Date: 02/14/2020 04:00 pm ---------------------------------------------------------------------- Performed By  Attending:        Johnell Comings MD         Ref. Address:     [place],                                                             [place] [address]  Performed By:     Novella Rob        [facility]                                     [facility]  Referred By:      Lavonia Drafts MD ---------------------------------------------------------------------- Orders  #  Description                           Code        Ordered By  1  [place] MFM FETAL BPP WO NON               [phone]    [facility]     STRESS ----------------------------------------------------------------------  #  Order #                      Accession #                Episode #  1  [id number]                   [id number]                 [id number] ---------------------------------------------------------------------- Indications  Gestational diabetes in pregnancy, diet        O24.410  controlled(uncontrolled)  Obesity complicating pregnancy, third          O99.213  trimester (pregravid BMI 31)  Poor obstetric history: Previous preterm       O09.219  delivery, antepartum (makena) 28 weeks  Previous cesarean delivery, antepartum         O34.219  [redacted] weeks gestation of pregnancy                Z3A.36 ---------------------------------------------------------------------- Vital Signs  Height:        5'3" ---------------------------------------------------------------------- Fetal Evaluation  Num Of Fetuses:         1  Fetal Heart Rate(bpm):  185  Cardiac Activity:       Tachycardia  Presentation:           Cephalic  Placenta:               Posterior  Amniotic Fluid  AFI FV:      Within normal limits  AFI Sum(cm)     %Tile       Largest Pocket(cm)  10.8            29          4.4  RUQ(cm)       RLQ(cm)       LUQ(cm)        LLQ(cm)  4.3           0             4.4            2.1 ---------------------------------------------------------------------- Biophysical Evaluation  Amniotic F.V:   Within normal limits       F. Tone:        Not Observed  F. Movement:    Not Observed               Score:          4/8  F. Breathing:   Observed ---------------------------------------------------------------------- OB History  Gravidity:    3         Prem:   1  TOP:          1 ---------------------------------------------------------------------- Gestational Age  LMP:           36w 6d        Date:  06/01/19                 EDD:   03/07/20  Best:          36w 6d     Det. By:  LMP  (06/01/19)          EDD:   03/07/20 ---------------------------------------------------------------------- Anatomy  Thoracic:               Appears normal         Bladder:                Appears normal  Stomach:               Appears normal, left                         sided ---------------------------------------------------------------------- Cervix Uterus Adnexa  Cervix  Not visualized (advanced GA >24wks) ---------------------------------------------------------------------- Comments  This patient was seen for a biophysical profile due to poorly  controlled gestational diabetes.  A biophysical profile performed today was 4 out of 8.  She  received a -2 for absent fetal tone and another -2 for absent  fetal movement.  Fetal tachycardia with a heart rate in the  170s to 180s was noted throughout today's exam.  There was normal amniotic fluid noted today.  Due to today's ultrasound findings, the patient was sent to the  MAU for evaluation immediately following today's ultrasound  exam.  At her current gestational age, I would have a low threshold  for delivery should the fetal status be nonreassuring or should  there be persistent fetal tachycardia noted on the fetal  heart  rate monitor. ----------------------------------------------------------------------                   Johnell Comings, MD Electronically Signed Final Report   02/14/2020 05:17 pm ----------------------------------------------------------------------  [place] MFM FETAL BPP WO NON STRESS  Result Date: 02/08/2020 ----------------------------------------------------------------------  OBSTETRICS REPORT                         (Signed Final 02/08/2020 12:39 pm) ---------------------------------------------------------------------- Patient Info  ID #:        [id number]                          D.O.B.:  May 01, 1991 (29 yrs)  Name:        Loretta Schmitt                  Visit Date: 02/07/2020 03:57 pm ---------------------------------------------------------------------- Performed By  Attending:         Sander Nephew      Ref. Address:      [address]                     [place],                                                               [place] [address]  Performed By:      Valda Favia          Location[facility]                     RD[facility]                                      Fetal Care  Referred By:       Lavonia Drafts                     MD ---------------------------------------------------------------------- Orders  #   Description                          Code         Ordered By  1   [place] MFM FETAL BPP WO NON              18299.37     [facility]      STRESS ----------------------------------------------------------------------  #   Order #                    Accession #                 Episode #  1   [id number]  [id number]                  [id number] ---------------------------------------------------------------------- Indications  [redacted] weeks gestation of pregnancy                 Z3A.35  Gestational diabetes in pregnancy, diet         O24.410  controlled(uncontrolled)  Obesity complicating pregnancy, third           O99.213  trimester (pregravid BMI 31)  Encounter for other antenatal screening         Z36.2  follow-up  Poor obstetric history: Previous preterm        O09.219  delivery, antepartum (makena) 28 weeks  Previous cesarean delivery, antepartum          O34.219 ---------------------------------------------------------------------- Vital Signs                                                  Height:        5'3" ---------------------------------------------------------------------- Fetal Evaluation  Num Of Fetuses:          1  Fetal Heart Rate(bpm):   157  Cardiac Activity:        Observed  Presentation:            Cephalic  Amniotic Fluid  AFI FV:      Within normal limits  AFI Sum(cm)     %Tile       Largest Pocket(cm)  13.             44          5.  RUQ(cm)       RLQ(cm)        LUQ(cm)        LLQ(cm)  3.6           3.1            1.3            5  ---------------------------------------------------------------------- Biophysical Evaluation  Amniotic F.V:   Within normal limits        F. Tone:         Observed  F. Movement:    Observed                    Score:           8/8  F. Breathing:   Observed ---------------------------------------------------------------------- OB History  Gravidity:     3         Prem:  1  TOP:           1 ---------------------------------------------------------------------- Gestational Age  LMP:            35w 6d       Date:  06/01/19                   EDD:  03/07/20  Best:           Barbie Haggis 6d    Det. By:  LMP  (06/01/19)            EDD:  03/07/20 ---------------------------------------------------------------------- Anatomy  Thoracic:               Appears normal         Bladder:                Appears normal  Stomach:                Appears normal, left                          sided ---------------------------------------------------------------------- Impression  Antenatal testing performed for GDM  The biophysical profile was 8/8  Good fetal movement and amniotic fluid was ---------------------------------------------------------------------- Recommendations  Follow up as clinically indicated. ----------------------------------------------------------------------               Sander Nephew, MD Electronically Signed Final Report   02/08/2020 12:39 pm ----------------------------------------------------------------------  [place] MFM FETAL BPP WO NON STRESS  Result Date: 01/27/2020 ----------------------------------------------------------------------  OBSTETRICS REPORT                       (Signed Final 01/27/2020 05:10 pm) ---------------------------------------------------------------------- Patient Info  ID #:       [id number]                          D.O.B.:  03/10/1991 (29 yrs)  Name:       Loretta Schmitt                  Visit Date: 01/27/2020 04:01 pm ----------------------------------------------------------------------  Performed By  Performed By:     Georgie Chard        Ref. Address:     [address] [place],                                                             [place] [address]  Attending:        Johnell Comings MD         Location[facility]                                                             [facility]etal [facility]  Referred By:      Lavonia Drafts MD ---------------------------------------------------------------------- Orders   #  Description                          Code         Ordered By   1  [place] MFM FETAL BPP WO NON  40814.48     [place]   2  [place] MFM OB FOLLOW UP                  18563.14     [facility]  ----------------------------------------------------------------------   #  Order #                    Accession #                 Episode #   1  [id number]                  [id number]                  [id number]   2  [id number]                  [id number]                  [id number]  ---------------------------------------------------------------------- Indications   Gestational diabetes in pregnancy, diet        O24.410   controlled   Obesity complicating pregnancy, third          O99.213   trimester (pregravid BMI 31)   Encounter for other antenatal screening        Z36.2   follow-up   Poor obstetric history: Previous preterm       O09.219   delivery, antepartum (makena) 28 weeks   Previous cesarean delivery, antepartum         O34.219   [redacted] weeks gestation of pregnancy                Z3A.34  ---------------------------------------------------------------------- Vital Signs                                                 Height:        5'3" ---------------------------------------------------------------------- Fetal Evaluation  Num Of Fetuses:         1  Fetal Heart Rate(bpm):  143  Cardiac Activity:       Observed  Presentation:            Cephalic  Placenta:               Posterior  P. Cord Insertion:      Previously Visualized  Amniotic Fluid  AFI FV:      Within normal limits  AFI Sum(cm)     %Tile       Largest Pocket(cm)  12.94           41          4.75  RUQ(cm)       RLQ(cm)       LUQ(cm)        LLQ(cm)  2.34          2.22          4.75           3.63 ---------------------------------------------------------------------- Biophysical Evaluation  Amniotic F.V:   Within normal limits       F. Tone:        Observed  F. Movement:    Observed                   Score:  8/8  F. Breathing:   Observed ---------------------------------------------------------------------- Biometry  BPD:        91  mm     G. Age:  36w 6d         97  %    CI:        77.68   %    70 - 86                                                          FL/HC:      20.0   %    19.4 - 21.8  HC:      326.8  mm     G. Age:  37w 1d         83  %    HC/AC:      0.98        0.96 - 1.11  AC:      333.2  mm     G. Age:  37w 2d       > 99  %    FL/BPD:     71.9   %    71 - 87  FL:       65.4  mm     G. Age:  33w 5d         26  %    FL/AC:      19.6   %    20 - 24  HUM:      59.2  mm     G. Age:  34w 2d         60  %  Est. FW:    2906  gm      6 lb 7 oz     93  % ---------------------------------------------------------------------- OB History  Gravidity:    3         Prem:   1  TOP:          1 ---------------------------------------------------------------------- Gestational Age  LMP:           34w 2d        Date:  06/01/19                 EDD:   03/07/20  U/S Today:     36w 2d                                        EDD:   02/22/20  Best:          34w 2d     Det. By:  LMP  (06/01/19)          EDD:   03/07/20 ---------------------------------------------------------------------- Comments  This patient was seen for a follow up growth scan due to  possible uncontrolled gestational diabetes.  She denies any  problems since her last exam.  She was informed that the fetal growth measures at  the 93rd  percentile for her gestational age.  There was normal  amniotic fluid noted today.  A biophysical profile performed today was 8 out of 8.  Due to  possible uncontrolled gestational diabetes, we will continue to  follow her with weekly fetal testing.  Another biophysical profile scheduled in 1  week. ----------------------------------------------------------------------                   Johnell Comings, MD Electronically Signed Final Report   01/27/2020 05:10 pm ----------------------------------------------------------------------  [place] MFM OB FOLLOW UP  Result Date: 01/27/2020 ----------------------------------------------------------------------  OBSTETRICS REPORT                       (Signed Final 01/27/2020 05:10 pm) ---------------------------------------------------------------------- Patient Info  ID #:       [id number]                          D.O.B.:  Aug 03, 1991 (29 yrs)  Name:       Loretta Schmitt                  Visit Date: 01/27/2020 04:01 pm ---------------------------------------------------------------------- Performed By  Performed By:     Georgie Chard        Ref. Address:     [address] [place],                                                             [place] [address]  Attending:        Johnell Comings MD         [facility]                                                             Fetal Care  Referred By:      Lavonia Drafts MD ---------------------------------------------------------------------- Orders   #  Description                          Code         Ordered By   1  [place] MFM FETAL BPP WO NON              76819.01     [facility]      STRESS   2  [place] MFM OB FOLLOW UP                  54270.62     [facility]  ----------------------------------------------------------------------   #  Order #  Accession #                  Episode #   1  [id number]                  [id number]                  [id number]   2  [id number]                  [id number]                  [id number]  ---------------------------------------------------------------------- Indications   Gestational diabetes in pregnancy, diet        O24.410   controlled   Obesity complicating pregnancy, third          O99.213   trimester (pregravid BMI 31)   Encounter for other antenatal screening        Z36.2   follow-up   Poor obstetric history: Previous preterm       O09.219   delivery, antepartum (makena) 28 weeks   Previous cesarean delivery, antepartum         O34.219   [redacted] weeks gestation of pregnancy                Z3A.34  ---------------------------------------------------------------------- Vital Signs                                                 Height:        5'3" ---------------------------------------------------------------------- Fetal Evaluation  Num Of Fetuses:         1  Fetal Heart Rate(bpm):  143  Cardiac Activity:       Observed  Presentation:           Cephalic  Placenta:               Posterior  P. Cord Insertion:      Previously Visualized  Amniotic Fluid  AFI FV:      Within normal limits  AFI Sum(cm)     %Tile       Largest Pocket(cm)  12.94           41          4.75  RUQ(cm)       RLQ(cm)       LUQ(cm)        LLQ(cm)  2.34          2.22          4.75           3.63 ---------------------------------------------------------------------- Biophysical Evaluation  Amniotic F.V:   Within normal limits       F. Tone:        Observed  F. Movement:    Observed                   Score:          8/8  F. Breathing:   Observed ---------------------------------------------------------------------- Biometry  BPD:        91  mm     G. Age:  36w 6d         97  %    CI:        77.68   %    70 - 86  FL/HC:      20.0   %    19.4 - 21.8  HC:      326.8  mm     G. Age:  37w 1d         83  %    HC/AC:       0.98        0.96 - 1.11  AC:      333.2  mm     G. Age:  37w 2d       > 99  %    FL/BPD:     71.9   %    71 - 87  FL:       65.4  mm     G. Age:  33w 5d         26  %    FL/AC:      19.6   %    20 - 24  HUM:      59.2  mm     G. Age:  34w 2d         60  %  Est. FW:    2906  gm      6 lb 7 oz     93  % ---------------------------------------------------------------------- OB History  Gravidity:    3         Prem:   1  TOP:          1 ---------------------------------------------------------------------- Gestational Age  LMP:           34w 2d        Date:  06/01/19                 EDD:   03/07/20  U/S Today:     36w 2d                                        EDD:   02/22/20  Best:          34w 2d     Det. By:  LMP  (06/01/19)          EDD:   03/07/20 ---------------------------------------------------------------------- Comments  This patient was seen for a follow up growth scan due to  possible uncontrolled gestational diabetes.  She denies any  problems since her last exam.  She was informed that the fetal growth measures at the 93rd  percentile for her gestational age.  There was normal  amniotic fluid noted today.  A biophysical profile performed today was 8 out of 8.  Due to  possible uncontrolled gestational diabetes, we will continue to  follow her with weekly fetal testing.  Another biophysical profile scheduled in 1 week. ----------------------------------------------------------------------                   Johnell Comings, MD Electronically Signed Final Report   01/27/2020 05:10 pm ----------------------------------------------------------------------    Assessment and Plan: Patient Active Problem List   Diagnosis Date Noted   Diet controlled gestational diabetes mellitus (GDM) in third trimester 12/01/2019   Supervision of high risk pregnancy, antepartum 09/20/2019   H/O: C-section 09/12/2019   History of preterm delivery 09/12/2019   Morning sickness 07/25/2019  Gestational hypertension     Admit to Antenatal NPO after midnight  Delivery planned via C/S on 5/12 Routine antenatal care  [place], [facility]  [place]  I discussed this patient with Dr Annamaria Boots, with MFM. Patient has established poor compliance with diabetes. With BPP 6/10 (reactive NST in setting of 4/8 BPP), he advised monitor overnight and delivery at 37 weeks, which would be tomorrow.   Patient also has new onset of GHTN.  Plan admission, continuous fetal monitoring, NPO after midnight, plan for RLTCS tomorrow.   Truett Mainland, DO 02/14/2020 9:54 PM

## 2020-02-14 NOTE — MAU Provider Note (Addendum)
History     CSN: [id number]  Arrival date and time: 02/14/20 4034   First Provider Initiated Contact with Patient 02/14/20 1806      Chief Complaint  Patient presents with  . Decreased Fetal Movement   HPI Loretta Schmitt is a 29 y.o. [id number] at 77w6dwho presents to MAU from MFM for evaluation of fetal tachycardia and BPP 4/8. She denies pain, vaginal bleeding, leaking of fluid, decreased fetal movement, fever, falls, or recent illness.   Patient denies history of elevated blood pressure. She denies headache,visual disturbances, RUQ/epigastric pain, new onset swelling or weight gain.  She receives prenatal care with [facility] She last ate two small frozen pizzas at 1430 hours.  OB History    Gravida  3   Para  1   Term      Preterm  1   AB  1   Living  1     SAB      TAB  1   Ectopic      Multiple      Live Births  1          Patient Active Problem List   Diagnosis Date Noted  . Diet controlled gestational diabetes mellitus (GDM) in third trimester 12/01/2019  . Supervision of high risk pregnancy, antepartum 09/20/2019  . H/O: C-section 09/12/2019  . History of preterm delivery 09/12/2019  . Morning sickness 07/25/2019   Past Medical History:  Diagnosis Date  . Anemia   . Asthma   . Scoliosis     Past Surgical History:  Procedure Laterality Date  . CESAREAN SECTION      No family history on file.  Social History   Tobacco Use  . Smoking status: Former Smoker    Quit date: 10/06/2012    Years since quitting: 7.3  . Smokeless tobacco: Never Used  Substance Use Topics  . Alcohol use: No  . Drug use: No    Allergies: No Known Allergies  Facility-Administered Medications Prior to Admission  Medication Dose Route Frequency Provider Last Rate Last Admin  . HYDROXYprogesterone Caproate SOAJ 275 mg  275 mg Subcutaneous Weekly HLavonia Drafts MD   275 mg at 10/05/19 1636   Medications Prior to Admission  Medication Sig Dispense  Refill Last Dose  . albuterol (VENTOLIN HFA) 108 (90 Base) MCG/ACT inhaler Inhale 2 puffs into the lungs every 6 (six) hours as needed for wheezing or shortness of breath. 6.7 g 0 Past Month at Unknown time  . Blood Glucose Monitoring Suppl (ACCU-CHEK GUIDE) w/Device KIT 1 Device by Does not apply route 4 (four) times daily. 1 kit 0 02/14/2020 at Unknown time  . Doxylamine-Pyridoxine (DICLEGIS) 10-10 MG TBEC 1 tab in AM, 1 tab mid afternoon 2 tabs at bedtime. Max dose 4 tabs daily. 100 tablet 5 Past Week at Unknown time  . prenatal vitamin w/FE, FA (NATACHEW) 29-1 MG CHEW chewable tablet Chew 1 tablet by mouth daily at 12 noon. 30 tablet 0 02/14/2020 at Unknown time  . scopolamine (TRANSDERM-SCOP) 1 MG/3DAYS Place 1 patch (1.5 mg total) onto the skin every 3 (three) days. 10 patch 1 Past Week at Unknown time  . Accu-Chek Softclix Lancets lancets Use as instructed 100 each 12   . Blood Pressure Monitoring (BLOOD PRESSURE KIT) DEVI 1 kit by Does not apply route as needed. 1 each 0   . glucose blood (ACCU-CHEK GUIDE) test strip Use to check blood sugars four times a day was instructed 50 each 12   .  metroNIDAZOLE (FLAGYL) 500 MG tablet Take 1 tablet (500 mg total) by mouth 2 (two) times daily. (Patient not taking: Reported on 02/09/2020) 14 tablet 0   . metroNIDAZOLE (FLAGYL) 500 MG tablet Take two tablets by mouth twice a day, for one day.  Or you can take all four tablets at once if you can tolerate it. 4 tablet 0   . omeprazole (PRILOSEC) 20 MG capsule Take 1 capsule (20 mg total) by mouth daily. (Patient not taking: Reported on 01/05/2020) 30 capsule 2     Review of Systems  Eyes: Negative for photophobia and visual disturbance.  Respiratory: Negative for shortness of breath.   Cardiovascular: Negative for palpitations.  Gastrointestinal: Negative for abdominal pain.  Genitourinary: Negative for dysuria, vaginal bleeding and vaginal discharge.  Musculoskeletal: Negative for back pain.  Neurological:  Negative for syncope, weakness and headaches.  All other systems reviewed and are negative.  Physical Exam   Blood pressure 131/80, pulse (!) 103, temperature 98.4 F (36.9 C), temperature source Oral, resp. rate 20, height _0  (1.626 m), weight 108.1 kg, last menstrual period 06/01/2019, SpO2 100 %.  Physical Exam  Nursing note and vitals reviewed. Constitutional: She is oriented to person, place, and time. She appears well-developed and well-nourished.  Cardiovascular: Normal rate and normal heart sounds.  Respiratory: Effort normal and breath sounds normal.  GI: She exhibits no distension. There is no abdominal tenderness. There is no rebound and no guarding.  Gravid  Neurological: She is alert and oriented to person, place, and time.  Skin: Skin is warm and dry.  Psychiatric: She has a normal mood and affect. Her behavior is normal. Judgment and thought content normal.    MAU Course/MDM  Procedures  --Report received from Dr. Annamaria Boots. Initial orders placed by Dr. Nehemiah Settle --Elevated BPs, onset at 1800, PEC labs ordered --EFM baseline improving with time and fluid bolus  Report given to V. Stann Mainland, CNM who assumes care of patient at this time.  Mallie Snooks, MSN, CNM Certified Nurse Midwife, Faculty Practice 02/14/20 7:11 PM   Labs reviewed:  Results for orders placed or performed during the hospital encounter of 02/14/20 (from the past 24 hour(s))  SARS Coronavirus 2 by RT PCR (hospital order, performed in [place] hospital lab) Nasopharyngeal Nasopharyngeal Swab     Status: None   Collection Time: 02/14/20  6:07 PM   Specimen: Nasopharyngeal Swab  Result Value Ref Range   SARS Coronavirus 2 NEGATIVE NEGATIVE  CBC     Status: Abnormal   Collection Time: 02/14/20  6:30 PM  Result Value Ref Range   WBC 7.7 4.0 - 10.5 K/uL   RBC 3.50 (L) 3.87 - 5.11 MIL/uL   Hemoglobin 10.2 (L) 12.0 - 15.0 g/dL   HCT 31.3 (L) 36.0 - 46.0 %   MCV 89.4 80.0 - 100.0 fL   MCH 29.1  26.0 - 34.0 pg   MCHC 32.6 30.0 - 36.0 g/dL   RDW 13.0 11.5 - 15.5 %   Platelets 261 150 - 400 K/uL   nRBC 0.0 0.0 - 0.2 %  Type and screen [place]     Status: None   Collection Time: 02/14/20  6:38 PM  Result Value Ref Range   ABO/RH(D) B POS    Antibody Screen NEG    Sample Expiration      02/17/2020,2359 Performed at [facility], [place] [address] [place], [place] [address]   ABO/Rh     Status: None  Collection Time: 02/14/20  6:38 PM  Result Value Ref Range   ABO/RH(D)      B POS Performed at [facility] [address] [place], [place] [address]   Protein / creatinine ratio, urine     Status: None   Collection Time: 02/14/20  6:55 PM  Result Value Ref Range   Creatinine, Urine 265.57 mg/dL   Total Protein, Urine 24 mg/dL   Protein Creatinine Ratio 0.09 0.00 - 0.15 mg/mg[Cre]  Comprehensive metabolic panel     Status: Abnormal   Collection Time: 02/14/20  6:55 PM  Result Value Ref Range   Sodium 136 135 - 145 mmol/L   Potassium 3.9 3.5 - 5.1 mmol/L   Chloride 108 98 - 111 mmol/L   CO2 21 (L) 22 - 32 mmol/L   Glucose, Bld 83 70 - 99 mg/dL   BUN 7 6 - 20 mg/dL   Creatinine, Ser 0.60 0.44 - 1.00 mg/dL   Calcium 8.7 (L) 8.9 - 10.3 mg/dL   Total Protein 6.3 (L) 6.5 - 8.1 g/dL   Albumin 2.8 (L) 3.5 - 5.0 g/dL   AST 22 15 - 41 U/L   ALT 23 0 - 44 U/L   Alkaline Phosphatase 132 (H) 38 - 126 U/L   Total Bilirubin 0.5 0.3 - 1.2 mg/dL   GFR calc non Af Amer >60 >60 mL/min   GFR calc Af Amer >60 >60 mL/min   Anion gap 7 5 - 15   PEC labs normal, diagnosed with GHTN today based on blood pressure.  Consult with Dr Nehemiah Settle who recommends admission to [facility] speciality care to regulate CBGs, NPO after midnight with plan for delivery tomorrow via C/S.   Assessment and Plan  Gestational diabetes  Gestational Hypertension   Admit to [facility] speciality care  Orders placed by Dr Nehemiah Settle   Lajean Manes, CNM 02/14/20, 8:43 PM

## 2020-02-14 NOTE — Progress Notes (Unsigned)
Patient with BPP 4/8. Referred to MAU for further EFM. Dr. Parke Poisson to notify attending on call.

## 2020-02-15 ENCOUNTER — Inpatient Hospital Stay (HOSPITAL_COMMUNITY): Payer: Managed Care, Other (non HMO) | Admitting: Anesthesiology

## 2020-02-15 ENCOUNTER — Encounter (HOSPITAL_COMMUNITY): Payer: Self-pay | Admitting: Family Medicine

## 2020-02-15 ENCOUNTER — Encounter (HOSPITAL_COMMUNITY)
Admission: AD | Disposition: A | Discharge status: Home / Self Care | Payer: Self-pay | Source: Home / Self Care | Attending: Family Medicine

## 2020-02-15 DIAGNOSIS — Z3A37 37 weeks gestation of pregnancy: Secondary | ICD-10-CM

## 2020-02-15 DIAGNOSIS — N858 Other specified noninflammatory disorders of uterus: Secondary | ICD-10-CM

## 2020-02-15 DIAGNOSIS — O24420 Gestational diabetes mellitus in childbirth, diet controlled: Secondary | ICD-10-CM

## 2020-02-15 DIAGNOSIS — O34211 Maternal care for low transverse scar from previous cesarean delivery: Secondary | ICD-10-CM

## 2020-02-15 DIAGNOSIS — O134 Gestational [pregnancy-induced] hypertension without significant proteinuria, complicating childbirth: Secondary | ICD-10-CM

## 2020-02-15 LAB — GLUCOSE, CAPILLARY
Glucose-Capillary: 92 mg/dL (ref 70–99)
Glucose-Capillary: 77 mg/dL (ref 70–99)

## 2020-02-15 SURGERY — Surgical Case
Anesthesia: Spinal | Wound class: Clean Contaminated

## 2020-02-15 MED ORDER — FENTANYL CITRATE (PF) 100 MCG/2ML IJ SOLN
25.0000 ug | INTRAMUSCULAR | Status: DC | PRN
  Administered 2020-02-15 (×2): 50 ug via INTRAVENOUS

## 2020-02-15 MED ORDER — MORPHINE SULFATE (PF) 0.5 MG/ML IJ SOLN
INTRAMUSCULAR | Status: AC
  Filled 2020-02-15: qty 10

## 2020-02-15 MED ORDER — ONDANSETRON HCL 4 MG/2ML IJ SOLN
INTRAMUSCULAR | Status: AC
  Filled 2020-02-15: qty 2

## 2020-02-15 MED ORDER — ZOLPIDEM TARTRATE 5 MG PO TABS: 5.0000 mg | ORAL_TABLET | Freq: Every evening | ORAL | Status: DC | PRN

## 2020-02-15 MED ORDER — ONDANSETRON HCL 4 MG/2ML IJ SOLN
4.0000 mg | Freq: Four times a day (QID) | INTRAMUSCULAR | Status: DC | PRN
  Administered 2020-02-15: 4 mg via INTRAVENOUS
  Filled 2020-02-15: qty 2

## 2020-02-15 MED ORDER — ONDANSETRON HCL 4 MG/2ML IJ SOLN
INTRAMUSCULAR | Status: DC | PRN
  Administered 2020-02-15: 4 mg via INTRAVENOUS

## 2020-02-15 MED ORDER — ONDANSETRON HCL 4 MG/2ML IJ SOLN: 4.0000 mg | Freq: Once | INTRAMUSCULAR | Status: DC | PRN

## 2020-02-15 MED ORDER — CEFAZOLIN SODIUM-DEXTROSE 2-4 GM/100ML-% IV SOLN
2.0000 g | INTRAVENOUS | Status: DC
  Filled 2020-02-15: qty 100

## 2020-02-15 MED ORDER — PHENYLEPHRINE 40 MCG/ML (10ML) SYRINGE FOR IV PUSH (FOR BLOOD PRESSURE SUPPORT)
PREFILLED_SYRINGE | INTRAVENOUS | Status: AC
  Filled 2020-02-15: qty 10

## 2020-02-15 MED ORDER — PHENYLEPHRINE HCL-NACL 20-0.9 MG/250ML-% IV SOLN
INTRAVENOUS | Status: AC
  Filled 2020-02-15: qty 250

## 2020-02-15 MED ORDER — OXYCODONE HCL 5 MG PO TABS: 5.0000 mg | ORAL_TABLET | ORAL | Status: DC | PRN

## 2020-02-15 MED ORDER — DIBUCAINE (PERIANAL) 1 % EX OINT: 1.0000 "application " | TOPICAL_OINTMENT | CUTANEOUS | Status: DC | PRN

## 2020-02-15 MED ORDER — CEFAZOLIN SODIUM-DEXTROSE 2-4 GM/100ML-% IV SOLN
INTRAVENOUS | Status: AC
  Filled 2020-02-15: qty 100

## 2020-02-15 MED ORDER — LACTATED RINGERS IV SOLN: INTRAVENOUS | Status: DC

## 2020-02-15 MED ORDER — ONDANSETRON 4 MG PO TBDP: 4.0000 mg | ORAL_TABLET | Freq: Four times a day (QID) | ORAL | Status: DC | PRN

## 2020-02-15 MED ORDER — OXYTOCIN 40 UNITS IN NORMAL SALINE INFUSION - SIMPLE MED: 2.5000 [IU]/h | INTRAVENOUS | Status: AC

## 2020-02-15 MED ORDER — LACTATED RINGERS IV SOLN: INTRAVENOUS | Status: DC | PRN

## 2020-02-15 MED ORDER — KETOROLAC TROMETHAMINE 30 MG/ML IJ SOLN
30.0000 mg | Freq: Four times a day (QID) | INTRAMUSCULAR | Status: AC
  Administered 2020-02-15 – 2020-02-16 (×4): 30 mg via INTRAVENOUS
  Filled 2020-02-15 (×4): qty 1

## 2020-02-15 MED ORDER — SENNOSIDES-DOCUSATE SODIUM 8.6-50 MG PO TABS
2.0000 | ORAL_TABLET | ORAL | Status: DC
  Administered 2020-02-15: 2 via ORAL
  Filled 2020-02-15 (×2): qty 2

## 2020-02-15 MED ORDER — ONDANSETRON HCL 4 MG/2ML IJ SOLN: 4.0000 mg | Freq: Four times a day (QID) | INTRAMUSCULAR | Status: DC | PRN

## 2020-02-15 MED ORDER — COCONUT OIL OIL: 1.0000 "application " | TOPICAL_OIL | Status: DC | PRN

## 2020-02-15 MED ORDER — SODIUM CHLORIDE 0.9 % IV SOLN: INTRAVENOUS | Status: DC | PRN

## 2020-02-15 MED ORDER — FENTANYL CITRATE (PF) 100 MCG/2ML IJ SOLN
INTRAMUSCULAR | Status: AC
  Filled 2020-02-15: qty 2

## 2020-02-15 MED ORDER — IBUPROFEN 800 MG PO TABS
800.0000 mg | ORAL_TABLET | Freq: Four times a day (QID) | ORAL | Status: DC
  Filled 2020-02-15: qty 1

## 2020-02-15 MED ORDER — MEPERIDINE HCL 25 MG/ML IJ SOLN: 6.2500 mg | INTRAMUSCULAR | Status: DC | PRN

## 2020-02-15 MED ORDER — OXYTOCIN 40 UNITS IN NORMAL SALINE INFUSION - SIMPLE MED
INTRAVENOUS | Status: AC
  Filled 2020-02-15: qty 1000

## 2020-02-15 MED ORDER — ACETAMINOPHEN 160 MG/5ML PO SOLN: 325.0000 mg | ORAL | Status: DC | PRN

## 2020-02-15 MED ORDER — DIPHENHYDRAMINE HCL 25 MG PO CAPS
25.0000 mg | ORAL_CAPSULE | Freq: Four times a day (QID) | ORAL | Status: DC | PRN
  Administered 2020-02-16: 25 mg via ORAL
  Filled 2020-02-15: qty 1

## 2020-02-15 MED ORDER — LIDOCAINE HCL (PF) 1 % IJ SOLN
INTRAMUSCULAR | Status: AC
  Filled 2020-02-15: qty 5

## 2020-02-15 MED ORDER — PHENYLEPHRINE HCL-NACL 20-0.9 MG/250ML-% IV SOLN
INTRAVENOUS | Status: DC | PRN
  Administered 2020-02-15: 60 ug/min via INTRAVENOUS

## 2020-02-15 MED ORDER — MORPHINE SULFATE (PF) 0.5 MG/ML IJ SOLN
INTRAMUSCULAR | Status: DC | PRN
  Administered 2020-02-15: 150 ug via INTRATHECAL

## 2020-02-15 MED ORDER — PRENATAL MULTIVITAMIN CH
1.0000 | ORAL_TABLET | Freq: Every day | ORAL | Status: DC
  Filled 2020-02-15: qty 1

## 2020-02-15 MED ORDER — ENOXAPARIN SODIUM 60 MG/0.6ML ~~LOC~~ SOLN
0.5000 mg/kg | SUBCUTANEOUS | Status: DC
  Administered 2020-02-16: 55 mg via SUBCUTANEOUS
  Filled 2020-02-15 (×2): qty 0.6

## 2020-02-15 MED ORDER — CEFAZOLIN SODIUM-DEXTROSE 2-3 GM-%(50ML) IV SOLR
INTRAVENOUS | Status: DC | PRN
  Administered 2020-02-15: 2 g via INTRAVENOUS

## 2020-02-15 MED ORDER — PHENYLEPHRINE HCL (PRESSORS) 10 MG/ML IV SOLN
INTRAVENOUS | Status: DC | PRN
  Administered 2020-02-15: 80 ug via INTRAVENOUS

## 2020-02-15 MED ORDER — SIMETHICONE 80 MG PO CHEW
80.0000 mg | CHEWABLE_TABLET | Freq: Three times a day (TID) | ORAL | Status: DC
  Administered 2020-02-16 – 2020-02-17 (×3): 80 mg via ORAL
  Filled 2020-02-15 (×3): qty 1

## 2020-02-15 MED ORDER — FENTANYL CITRATE (PF) 100 MCG/2ML IJ SOLN
INTRAMUSCULAR | Status: DC | PRN
  Administered 2020-02-15: 15 ug via INTRATHECAL

## 2020-02-15 MED ORDER — SIMETHICONE 80 MG PO CHEW: 80.0000 mg | CHEWABLE_TABLET | ORAL | Status: DC | PRN

## 2020-02-15 MED ORDER — OXYCODONE HCL 5 MG/5ML PO SOLN: 5.0000 mg | Freq: Once | ORAL | Status: DC | PRN

## 2020-02-15 MED ORDER — MENTHOL 3 MG MT LOZG: 1.0000 | LOZENGE | OROMUCOSAL | Status: DC | PRN

## 2020-02-15 MED ORDER — SIMETHICONE 80 MG PO CHEW
80.0000 mg | CHEWABLE_TABLET | ORAL | Status: DC
  Administered 2020-02-15 – 2020-02-16 (×2): 80 mg via ORAL
  Filled 2020-02-15 (×2): qty 1

## 2020-02-15 MED ORDER — BUPIVACAINE IN DEXTROSE 0.75-8.25 % IT SOLN
INTRATHECAL | Status: DC | PRN
  Administered 2020-02-15: 1.6 mL via INTRATHECAL

## 2020-02-15 MED ORDER — OXYTOCIN 10 UNIT/ML IJ SOLN
INTRAMUSCULAR | Status: DC | PRN
  Administered 2020-02-15: 40 [IU]

## 2020-02-15 MED ORDER — WITCH HAZEL-GLYCERIN EX PADS: 1.0000 "application " | MEDICATED_PAD | CUTANEOUS | Status: DC | PRN

## 2020-02-15 MED ORDER — SOD CITRATE-CITRIC ACID 500-334 MG/5ML PO SOLN
30.0000 mL | Freq: Once | ORAL | Status: AC
  Administered 2020-02-15: 30 mL via ORAL
  Filled 2020-02-15: qty 30

## 2020-02-15 MED ORDER — ACETAMINOPHEN 325 MG PO TABS
650.0000 mg | ORAL_TABLET | Freq: Four times a day (QID) | ORAL | Status: DC | PRN
  Administered 2020-02-16: 650 mg via ORAL
  Filled 2020-02-15: qty 2

## 2020-02-15 MED ORDER — ACETAMINOPHEN 325 MG PO TABS: 325.0000 mg | ORAL_TABLET | ORAL | Status: DC | PRN

## 2020-02-15 MED ORDER — OXYCODONE HCL 5 MG PO TABS: 5.0000 mg | ORAL_TABLET | Freq: Once | ORAL | Status: DC | PRN

## 2020-02-15 MED ORDER — TETANUS-DIPHTH-ACELL PERTUSSIS 5-2.5-18.5 LF-MCG/0.5 IM SUSP: 0.5000 mL | Freq: Once | INTRAMUSCULAR | Status: DC

## 2020-02-15 SURGICAL SUPPLY — 28 items
BENZOIN TINCTURE PRP APPL 2/3 (GAUZE/BANDAGES/DRESSINGS) ×4 IMPLANT
CHLORAPREP W/TINT 26ML (MISCELLANEOUS) ×2 IMPLANT
CLOTH BEACON ORANGE TIMEOUT ST (SAFETY) ×2 IMPLANT
DRESSING PREVENA PLUS CUSTOM (GAUZE/BANDAGES/DRESSINGS) ×1 IMPLANT
DRSG OPSITE POSTOP 4X10 (GAUZE/BANDAGES/DRESSINGS) ×2 IMPLANT
DRSG PREVENA PLUS CUSTOM (GAUZE/BANDAGES/DRESSINGS) ×2
ELECT REM PT RETURN 9FT ADLT (ELECTROSURGICAL) ×2
ELECTRODE REM PT RTRN 9FT ADLT (ELECTROSURGICAL) ×1 IMPLANT
GLOVE BIOGEL PI IND STRL 7.0 (GLOVE) ×3 IMPLANT
GLOVE BIOGEL PI INDICATOR 7.0 (GLOVE) ×3
GLOVE ECLIPSE 6.5 STRL STRAW (GLOVE) ×2 IMPLANT
GOWN STRL REUS W/ TWL LRG LVL3 (GOWN DISPOSABLE) ×2 IMPLANT
GOWN STRL REUS W/TWL LRG LVL3 (GOWN DISPOSABLE) ×2
KIT PREVENA INCISION MGT20CM45 (CANNISTER) ×2 IMPLANT
NS IRRIG 1000ML POUR BTL (IV SOLUTION) ×2 IMPLANT
PAD OB MATERNITY 4.3X12.25 (PERSONAL CARE ITEMS) ×2 IMPLANT
PAD PREP 24X48 CUFFED NSTRL (MISCELLANEOUS) ×2 IMPLANT
RETRACTOR WND ALEXIS 25 LRG (MISCELLANEOUS) IMPLANT
RTRCTR WOUND ALEXIS 25CM LRG (MISCELLANEOUS)
STRIP CLOSURE SKIN 1/2X4 (GAUZE/BANDAGES/DRESSINGS) ×2 IMPLANT
SUT PLAIN 2 0 XLH (SUTURE) ×2 IMPLANT
SUT VIC AB 0 CT1 36 (SUTURE) ×4 IMPLANT
SUT VIC AB 2-0 CT1 27 (SUTURE) ×1
SUT VIC AB 2-0 CT1 TAPERPNT 27 (SUTURE) ×1 IMPLANT
SUT VIC AB 4-0 KS 27 (SUTURE) ×2 IMPLANT
TOWEL OR 17X24 6PK STRL BLUE (TOWEL DISPOSABLE) ×6 IMPLANT
TRAY FOLEY CATH SILVER 16FR (SET/KITS/TRAYS/PACK) ×2 IMPLANT
WATER STERILE IRR 1000ML POUR (IV SOLUTION) ×2 IMPLANT

## 2020-02-15 NOTE — Anesthesia Procedure Notes (Signed)
Spinal  Patient location during procedure: OR Start time: 02/15/2020 10:40 AM End time: 02/15/2020 10:45 AM Preanesthetic Checklist Completed: patient identified, IV checked, site marked, risks and benefits discussed, surgical consent, monitors and equipment checked, pre-op evaluation and timeout performed Spinal Block Patient position: sitting Prep: ChloraPrep Patient monitoring: heart rate, cardiac monitor, continuous pulse ox and blood pressure Approach: midline Location: L4-5 Injection technique: single-shot Needle Needle type: Pencil-Tip  Needle gauge: 24 G Needle length: 5 cm Assessment Sensory level: T6 Additional Notes FF CSF. Clear aspirate before and after injection of 1.8mL Bupi 0.75% + Fen & Duramorph.

## 2020-02-15 NOTE — Op Note (Addendum)
[name] PROCEDURE DATE: 02/15/2020  PREOPERATIVE DIAGNOSES: Intrauterine pregnancy at [redacted]w[redacted]d weeks gestation; prior c-section and desires repeat, gHTN, poorly-controlled GDM  POSTOPERATIVE DIAGNOSES: The same; Vacuum-Assisted Extraction  PROCEDURE: Repeat Low Transverse Cesarean Section  SURGEON:  Dr. Lyndel Safe - Primary Dr. Jerilynn Birkenhead - Fellow  ANESTHESIOLOGY TEAM: Anesthesiologist: Bethena Midget, MD CRNA: Rhymer, Doree Fudge, CRNA  INDICATIONS: Loretta Schmitt is a 29 y.o. [phone] at [redacted]w[redacted]d here for cesarean section secondary to the indications listed under preoperative diagnoses; please see preoperative note for further details.  The risks of cesarean section were discussed with the patient including but were not limited to: bleeding which may require transfusion or reoperation; infection which may require antibiotics; injury to bowel, bladder, ureters or other surrounding organs; injury to the fetus; need for additional procedures including hysterectomy in the event of a life-threatening hemorrhage; placental abnormalities wth subsequent pregnancies, incisional problems, thromboembolic phenomenon and other postoperative/anesthesia complications.   The patient concurred with the proposed plan, giving informed written consent for the procedure.    FINDINGS:  Viable female infant in cephalic presentation. Clear amniotic fluid.  Intact placenta, three vessel cord.  Normal uterus, fallopian tubes and ovaries bilaterally. Omental adhesions to peritoneum present. Mild adhesions of rectus to fascia. APGAR (1 MIN): 8   APGAR (5 MINS): 9   APGAR (10 MINS):    ANESTHESIA: Spinal INTRAVENOUS FLUIDS: 1700 ml   ESTIMATED BLOOD LOSS: 512 ml URINE OUTPUT:  100 ml SPECIMENS: Placenta sent to pathology COMPLICATIONS: None immediate  PROCEDURE IN DETAIL:  The patient preoperatively received intravenous antibiotics and had sequential compression devices applied to her lower extremities.  She was  then taken to the operating room where spinal anesthesia was administered and was found to be adequate. She was then placed in a dorsal supine position with a leftward tilt, and prepped and draped in a sterile manner.  A foley catheter was placed into her bladder and attached to constant gravity.  After an adequate timeout was performed, a Pfannenstiel skin incision was made with scalpel on her preexisting scar and carried through to the underlying layer of fascia. The fascia was incised in the midline, and this incision was extended bilaterally using the Mayo scissors.  Kocher clamps were applied to the superior aspect of the fascial incision and the underlying rectus muscles were dissected off bluntly and sharply.  A similar process was carried out on the inferior aspect of the fascial incision. The rectus muscles were separated in the midline and the peritoneum was entered bluntly. The Alexis self-retaining retractor was introduced into the abdominal cavity.  Attention was turned to the lower uterine segment where a low transverse hysterotomy was made with a scalpel and extended bilaterally bluntly. Due to asynclitic position of infant head causing difficult extraction manually, Kiwi vacuum applied and the infant was successfully delivered, the cord was clamped and cut after one minute, and the infant was handed over to the awaiting neonatology team. Uterine massage was then administered, and the placenta delivered intact with a three-vessel cord. The uterus was then cleared of clots and debris.  The hysterotomy was closed with 0 Vicryl in a running locked fashion, and an imbricating layer was also placed with 0 Vicryl. The pelvis was cleared of all clot and debris. Hemostasis was confirmed on all surfaces.  The retractor was removed.  The peritoneum was closed with a 3-0 Vicryl running stitch. The fascia was then closed using 0 Vicryl in a running fashion.  The subcutaneous layer was  irrigated and reapproximated  with 2-0 plain gut running stitches. The skin was closed with a 4-0 Vicryl subcuticular stitch. After the skin was closed, a Prevena disposable negative pressure wound therapy device was placed over the incision.  The suction was activated at a pressure of 80 mmHg.  The adhesive was affixed well and there were no leaks noted. The patient tolerated the procedure well. Sponge, instrument and needle counts were correct x 3.  She was taken to the recovery room in stable condition.   Barrington Ellison, MD [facility] Fellow, [facility] for [employer], [place]

## 2020-02-15 NOTE — Anesthesia Postprocedure Evaluation (Signed)
Anesthesia Post Note  Patient: Loretta Schmitt  Procedure(s) Performed: CESAREAN SECTION (N/A )     Patient location during evaluation: PACU Anesthesia Type: Spinal Level of consciousness: oriented and awake and alert Pain management: pain level controlled Vital Signs Assessment: post-procedure vital signs reviewed and stable Respiratory status: spontaneous breathing, respiratory function stable and patient connected to nasal cannula oxygen Cardiovascular status: blood pressure returned to baseline and stable Postop Assessment: no headache, no backache and no apparent nausea or vomiting Anesthetic complications: no    Last Vitals:  Vitals:   02/15/20 1435 02/15/20 1653  BP: 129/82 127/89  Pulse: 83 77  Resp: 16 16  Temp:    SpO2: 100% 100%    Last Pain:  Vitals:   02/15/20 1340  TempSrc: Oral  PainSc:    Pain Goal: Patients Stated Pain Goal: 3 (02/14/20 2139)              Epidural/Spinal Function Cutaneous sensation: Tingles (02/15/20 1653), Patient able to flex knees: Yes (02/15/20 1653), Patient able to lift hips off bed: Yes (02/15/20 1653), Back pain beyond tenderness at insertion site: No (02/15/20 1653), Progressively worsening motor and/or sensory loss: No (02/15/20 1653), Bowel and/or bladder incontinence post epidural: No (02/15/20 1653)  Kian Ottaviano

## 2020-02-15 NOTE — Discharge Summary (Signed)
Postpartum Discharge Summary     Patient Name: Loretta Schmitt DOB: [birth year] MRN: [id number]  Date of admission: 02/14/2020 Delivery date:02/15/2020  Delivering provider: Caren Macadam  Date of discharge: 02/17/2020  Admitting diagnosis: NST (non-stress test) nonreactive [O28.8] Intrauterine pregnancy: [redacted]w[redacted]d    Secondary diagnosis:  Active Problems:   H/O: C-section   History of preterm delivery   Diet controlled gestational diabetes mellitus (GDM) in third trimester   NST (non-stress test) nonreactive   Vacuum-assisted cesarean delivery, delivered, current hospitalization  Additional problems: None    Discharge diagnosis: Term Pregnancy Delivered, Gestational Hypertension and poorly-controlled GDM                                              Post partum procedures:None Augmentation: N/A Complications: None  Hospital course: Sceduled C/S   29y.o. yo [id number] admitted to the hospital [facility]14/2020 [facility] She was diagnosed with gHTN and also noted to have poorly-controlled GDM. Decision made to proceed with c-section then next day when patient was 37 weeks. Delivery details are as follows:  Membrane Rupture Time/Date: 11:11 AM ,02/15/2020   Delivery Method:C-Section, Vacuum Assisted  Details of operation can be found in separate operative note.  Patient had an uncomplicated postpartum course. BP's monitored and mild range; Enalapril 5 mg initiated and prescribed on discharge. Fasting AM glucose 89. She is ambulating, tolerating a regular diet, passing flatus, and urinating well. Patient is discharged home in stable condition on  02/17/20        Newborn Data: Birth date:02/15/2020  Birth time:11:13 AM  Gender:Female  Living status:Living  Apgars:8 ,9     Magnesium Sulfate received: No BMZ received: No Rhophylac:No MMR:No T-DaP:Given prenatally Flu: No Transfusion:No  Physical exam  Vitals:   02/16/20 1045 02/16/20 1756  02/16/20 2020 02/17/20 0518  BP: (!) 133/59 129/70 (!) 121/95 136/85  Pulse: 95 90 92 93  Resp: '18 17 18 18  ' Temp: 98.2 F (36.8 C) 98.3 F (36.8 C) 98.1 F (36.7 C) 98 F (36.7 C)  TempSrc: Oral  Oral Oral  SpO2:      Weight:      Height:       General: alert, cooperative and no distress Lochia: appropriate Uterine Fundus: firm Incision: Healing well with no significant drainage, No significant erythema DVT Evaluation: No evidence of DVT seen on physical exam. Labs: Lab Results  Component Value Date   WBC 11.4 (H) 02/16/2020   HGB 9.2 (L) 02/16/2020   HCT 28.1 (L) 02/16/2020   MCV 88.1 02/16/2020   PLT 249 02/16/2020   CMP Latest Ref Rng & Units 02/16/2020  Glucose 70 - 99 mg/dL -  BUN 6 - 20 mg/dL -  Creatinine 0.44 - 1.00 mg/dL 0.70  Sodium 135 - 145 mmol/L -  Potassium 3.5 - 5.1 mmol/L -  Chloride 98 - 111 mmol/L -  CO2 22 - 32 mmol/L -  Calcium 8.9 - 10.3 mg/dL -  Total Protein 6.5 - 8.1 g/dL -  Total Bilirubin 0.3 - 1.2 mg/dL -  Alkaline Phos 38 - 126 U/L -  AST 15 - 41 U/L -  ALT 0 - 44 U/L -   Edinburgh Score: Edinburgh Postnatal Depression Scale Screening Tool 02/15/2020  I have been able to laugh and see the funny  side of things. (No Data)      After visit meds:  Allergies as of 02/17/2020   No Known Allergies     Medication List    STOP taking these medications   Accu-Chek Guide test strip Generic drug: glucose blood   Accu-Chek Guide w/Device Kit   Accu-Chek Softclix Lancets lancets   Doxylamine-Pyridoxine 10-10 MG Tbec Commonly known as: Diclegis   metroNIDAZOLE 500 MG tablet Commonly known as: FLAGYL   omeprazole 20 MG capsule Commonly known as: PriLOSEC   scopolamine 1 MG/3DAYS Commonly known as: TRANSDERM-SCOP     TAKE these medications   acetaminophen 325 MG tablet Commonly known as: TYLENOL Take 2 tablets (650 mg total) by mouth every 6 (six) hours as needed for mild pain (temperature > 101.5.).   albuterol 108 (90  Base) MCG/ACT inhaler Commonly known as: VENTOLIN HFA Inhale 2 puffs into the lungs every 6 (six) hours as needed for wheezing or shortness of breath.   Blood Pressure Kit Devi 1 kit by Does not apply route as needed.   enalapril 5 MG tablet Commonly known as: VASOTEC Take 1 tablet (5 mg total) by mouth daily.   ibuprofen 800 MG tablet Commonly known as: ADVIL Take 1 tablet (800 mg total) by mouth every 8 (eight) hours as needed.   oxyCODONE 5 MG immediate release tablet Commonly known as: Oxy IR/ROXICODONE Take 1-2 tablets (5-10 mg total) by mouth every 4 (four) hours as needed for moderate pain.   prenatal vitamin w/FE, FA 29-1 MG Chew chewable tablet Chew 1 tablet by mouth daily at 12 noon.   senna-docusate 8.6-50 MG tablet Commonly known as: Senokot-S Take 2 tablets by mouth daily. Start taking on: Feb 18, 2020        Discharge home in stable condition Infant Feeding: Bottle and Breast Infant Disposition:home with mother Discharge instruction: per After Visit Summary and Postpartum booklet. Activity: Advance as tolerated. Pelvic rest for 6 weeks.  Diet: routine diet Anticipated Birth Control: POPs Postpartum Appointment:4 weeks Additional Postpartum F/U: 2 hour GTT in 4-6 weeks, Incision check 1 week and BP check 1 week Future Appointments: Future Appointments  Date Time Provider [place]  02/23/2020 11:00 AM [place] None  03/28/2020  8:15 AM [facility] None  03/28/2020  8:30 AM [place] Mode, MD [facility] None   Follow up Visit:      02/17/2020 Chauncey Mann, MD

## 2020-02-15 NOTE — Transfer of Care (Signed)
Immediate Anesthesia Transfer of Care Note  Patient: Loretta Schmitt  Procedure(s) Performed: CESAREAN SECTION (N/A )  Patient Location: PACU  Anesthesia Type:Spinal  Level of Consciousness: awake, alert  and oriented  Airway & Oxygen Therapy: Patient Spontanous Breathing  Post-op Assessment: Report given to RN and Post -op Vital signs reviewed and stable  Post vital signs: Reviewed and stable  Last Vitals:  Vitals Value Taken Time  BP 129/82 02/15/20 1435  Temp 36.6 C 02/15/20 1340  Pulse 83 02/15/20 1435  Resp 16 02/15/20 1435  SpO2 100 % 02/15/20 1435    Last Pain:  Vitals:   02/15/20 1340  TempSrc: Oral  PainSc:       Patients Stated Pain Goal: 3 (02/14/20 2139)  Complications: No apparent anesthesia complications

## 2020-02-15 NOTE — Progress Notes (Signed)
Patient ID: Loretta Schmitt, female   DOB: [birth year], 29 y.o.   MRN: [id number]   The risks of cesarean section were discussed with the patient including but were not limited to: bleeding which may require transfusion or reoperation; infection which may require antibiotics; injury to bowel, bladder, ureters or other surrounding organs; injury to the fetus; need for additional procedures including hysterectomy in the event of a life-threatening hemorrhage; placental abnormalities wth subsequent pregnancies, incisional problems, thromboembolic phenomenon and other postoperative/anesthesia complications.   Patient has been NPO since midnight she will remain NPO for procedure. Anesthesia and OR aware.  Preoperative prophylactic antibiotics and SCDs ordered on call to the OR.  To OR when ready.   Discussed the following as well: Infant feeding: breastmilk, desires to express/pump BM  Contraception: planning on OCP- discussed starting at 6wks POP if still producing BM or COCP if using formula. Counseled on recommendation for 18 months between pregnancies.   Pediatric care: has pediatrician in mind, discussed they can call and set up appt for weight check. Will need appt in 1-2 day after discharge    Postpartum needs that still need be addressed: Circumcision

## 2020-02-15 NOTE — Anesthesia Preprocedure Evaluation (Signed)
Anesthesia Evaluation  Patient identified by MRN, date of birth, ID band Patient awake    Reviewed: Allergy & Precautions, H&P , NPO status , Patient's Chart, lab work & pertinent test results, reviewed documented beta blocker date and time   Airway Mallampati: II  TM Distance: >3 FB Neck ROM: full    Dental no notable dental hx.    Pulmonary asthma , former smoker,    Pulmonary exam normal breath sounds clear to auscultation       Cardiovascular negative cardio ROS Normal cardiovascular exam Rhythm:regular Rate:Normal     Neuro/Psych negative neurological ROS  negative psych ROS   GI/Hepatic negative GI ROS, Neg liver ROS,   Endo/Other  diabetes, GestationalMorbid obesity  Renal/GU negative Renal ROS  negative genitourinary   Musculoskeletal   Abdominal (+) + obese,   Peds  Hematology  (+) Blood dyscrasia, anemia ,   Anesthesia Other Findings   Reproductive/Obstetrics (+) Pregnancy                             Anesthesia Physical Anesthesia Plan  ASA: II  Anesthesia Plan: Spinal   Post-op Pain Management:    Induction:   PONV Risk Score and Plan: 2  Airway Management Planned: Nasal Cannula, Simple Face Mask and Mask  Additional Equipment:   Intra-op Plan:   Post-operative Plan:   Informed Consent: I have reviewed the patients History and Physical, chart, labs and discussed the procedure including the risks, benefits and alternatives for the proposed anesthesia with the patient or authorized representative who has indicated his/her understanding and acceptance.       Plan Discussed with: Anesthesiologist and CRNA  Anesthesia Plan Comments:         Anesthesia Quick Evaluation

## 2020-02-16 ENCOUNTER — Encounter (HOSPITAL_COMMUNITY): Payer: Self-pay | Admitting: Anesthesiology

## 2020-02-16 LAB — CREATININE, SERUM
Creatinine, Ser: 0.7 mg/dL (ref 0.44–1.00)
GFR calc Af Amer: >60 mL/min (ref >60)
GFR calc non Af Amer: >60 mL/min (ref >60)

## 2020-02-16 LAB — CBC
HCT: 28.1 % — ABNORMAL LOW (ref 36.0–46.0)
Hemoglobin: 9.2 g/dL — ABNORMAL LOW (ref 12.0–15.0)
MCH: 28.8 pg (ref 26.0–34.0)
MCHC: 32.7 g/dL (ref 30.0–36.0)
MCV: 88.1 fL (ref 80.0–100.0)
Platelets: 249 10*3/uL (ref 150–400)
RBC: 3.19 MIL/uL — ABNORMAL LOW (ref 3.87–5.11)
RDW: 12.7 % (ref 11.5–15.5)
WBC: 11.4 10*3/uL — ABNORMAL HIGH (ref 4.0–10.5)
nRBC: 0 % (ref 0.0–0.2)

## 2020-02-16 LAB — GLUCOSE, CAPILLARY: Glucose-Capillary: 89 mg/dL (ref 70–99)

## 2020-02-16 LAB — RPR: RPR Ser Ql: NONREACTIVE

## 2020-02-16 LAB — SURGICAL PATHOLOGY

## 2020-02-16 MED ORDER — FERROUS FUMARATE 324 (106 FE) MG PO TABS
1.0000 | ORAL_TABLET | Freq: Every day | ORAL | Status: DC
  Administered 2020-02-16 – 2020-02-17 (×2): 106 mg via ORAL
  Filled 2020-02-16 (×2): qty 1

## 2020-02-16 MED ORDER — IBUPROFEN 100 MG/5ML PO SUSP
800.0000 mg | Freq: Four times a day (QID) | ORAL | Status: DC
  Administered 2020-02-16 – 2020-02-17 (×2): 800 mg via ORAL
  Filled 2020-02-16 (×2): qty 40

## 2020-02-16 NOTE — Progress Notes (Signed)
POSTPARTUM PROGRESS NOTE  Subjective: Loretta Schmitt is a 29 y.o. [id number] s/p rLTCS at [redacted]w[redacted]d.  She reports she doing well. No acute events overnight. She denies any problems with ambulating, voiding or po intake. Denies nausea or vomiting. She has not passed flatus. Pain is well controlled.  Lochia is normal.  Objective: Blood pressure 128/72, pulse 80, temperature 98 F (36.7 C), temperature source Oral, resp. rate 20, height 5\' 4"  (1.626 m), weight 108.1 kg, last menstrual period 06/01/2019, SpO2 100 %, unknown if currently breastfeeding.  Physical Exam:  General: alert, cooperative and no distress Chest: no respiratory distress Abdomen: soft, non-tender  Uterine Fundus: firm, appropriately tender. Incision without erythema, bleeding minimal. Extremities: No calf swelling or tenderness  no edema  Recent Labs    02/14/20 1830  HGB 10.2*  HCT 31.3*    Assessment/Plan: Loretta Schmitt is a 29 y.o. 37 s/p rLTCS at [redacted]w[redacted]d.  Routine Postpartum Care: Doing well, pain well-controlled.  -- Continue routine care, lactation support  -- Contraception: pills -- Feeding: Both  Dispo: Plan for discharge likely 5/14 AM.  6/14, DO, PGY1 [facility] [place]

## 2020-02-16 NOTE — Addendum Note (Signed)
Addendum  created 02/16/20 1332 by Bethena Midget, MD   Attestation recorded in Intraprocedure, Intraprocedure Attestations filed

## 2020-02-16 NOTE — Lactation Note (Signed)
This note was copied from a baby's chart. Lactation Consultation Note  Patient Name: Loretta Schmitt [id number] Date: 02/16/2020 Reason for consult: Initial assessment P2, 13 hour ETI female infant, LGA greater than 9 lbs. Mom's hx: C/S delivery and GDM in pregnancy.  Mom's feeding choice is breast and formula feeding. Per mom, her 29 year old had latch difficulties was born at 50 weeks in NICU for 6 weeks so she mostly pumped and stopped breastfeeding (pumping) at 6 weeks postpartum.  Tools given: breast shells, hand pump and DEBP due to mom having flat nipples and infant not latching well at breast been spitty earlier today.   Per parents, infant latched in recovery for 10 minutes and mom has made other attempts, infant has been nasal and had emesis earlier. Infant has been given formula earlier today  due to low blood sugar and mom's feeding choice as an feeding supplement.  LC entered the room their was a pacifier in crib, parents will delay pacifier use and understands to wait until infant is 3 weeks or older before using it due to: increase risk on mom having low milk supply, infant not latching well at breast and infant cuing out hunger cues. Mom has been doing a lot of STS with infant.  LC asked mom to do breast stimulation prior to latching infant on her her rightt breast using the football hold. Infant latched without difficulty for 7 minutes, infant sounded  nasal and congested while breastfeeding. Mom taught back hand expression and infant was given 2 mls of colostrum and afterwards did STS with dad. Mom started using DEBP that was given by RN when [facility] was in the room, mom understands to pump every 3 hours for 15 minutes.  Mom will continue to breastfeed infant according to hunger cues, 8 to 12+ times within 24 hours and not exceed 3 hours without breastfeeding infant. Mom will pre-pump breast prior to latching infant at breast to help evert nipple shaft out, and mom will wear breast  shells in bra during the day and not at night. Mom made aware of O/P services, breastfeeding support groups, community resources, and our phone # for post-discharge questions.   Maternal Data Formula Feeding for Exclusion: Yes Reason for exclusion: Mother's choice to formula and breast feed on admission Has patient been taught Hand Expression?: Yes Does the patient have breastfeeding experience prior to this delivery?: Yes  Feeding Feeding Type: Breast Fed Nipple Type: Slow - flow  LATCH Score Latch: Grasps breast easily, tongue down, lips flanged, rhythmical sucking.  Audible Swallowing: A few with stimulation  Type of Nipple: Flat  Comfort (Breast/Nipple): Soft / non-tender  Hold (Positioning): Assistance needed to correctly position infant at breast and maintain latch.  LATCH Score: 7  Interventions Interventions: Breast feeding basics reviewed;Breast compression;Assisted with latch;Adjust position;Skin to skin;DEBP;Support pillows;Hand pump;Position options;Breast massage;Hand express;Expressed milk;Pre-pump if needed;Shells  Lactation Tools Discussed/Used Tools: Shells;Pump Shell Type: Other (comment)(flat nipples) Breast pump type: Double-Electric Breast Pump;Manual WIC Program: No Pump Review: Setup, frequency, and cleaning;Milk Storage Initiated by:: by RN Date initiated:: 02/16/20   Consult Status Consult Status: Follow-up Date: 02/17/20 Follow-up type: In-patient    Loretta Schmitt 02/16/2020, 12:15 AM

## 2020-02-16 NOTE — Lactation Note (Signed)
This note was copied from a baby's chart. Lactation Consultation Note  Patient Name: Loretta Schmitt [id number] Date: 02/16/2020 Reason for consult: Follow-up assessment;Early term 42-38.6wks  P2 mother whose infant is now 58 hours old.  This is an ETI at 37+0 weeks weighing > 9 lbs.  Mother had GDM.  Mother's feeding preference on admission was breast/bottle.  She pumped and bottle fed her first child.  Mother was changing baby's diaper when I arrived.  She had just finished feeding him formula.  Mother has not breast fed at all since approximately midnight.  In discussing baby's feeding plan with her, mother stated twice that if baby wants formula she will feed him formula.  Asked her if she had been offering the breast prior to supplementation and mother stated she is doing this.  She feels like he latches well but is not satisfied.  Encouraged her to continue feeding 8-12 times/24 hours or sooner if he shows feeding cues.  Reviewed cues and mother informed me that she is seeing him cue.  She is happy to see him bottle feed because he is content.  Offered to assist with the next feeding and mother agreeable.  Asked her to call me when she sees baby showing feeding cues.  Mother was given breast shells and a hand pump early this a.m. from the lactation consultant. A DEBP has been set up and mother instructed to pump every three hours and to feed back any EBM she obtains to baby.  She has been taught hand expression.  Mother has a DEBP for home use.  No support person present at this time.   Maternal Data Formula Feeding for Exclusion: Yes Reason for exclusion: Mother's choice to formula and breast feed on admission Has patient been taught Hand Expression?: Yes Does the patient have breastfeeding experience prior to this delivery?: No  Feeding Feeding Type: Bottle Fed - Formula Nipple Type: Slow - flow  LATCH Score                   Interventions    Lactation Tools  Discussed/Used WIC Program: No   Consult Status Consult Status: Follow-up Date: 02/17/20 Follow-up type: In-patient    Cormick Moss R Kemper Hochman 02/16/2020, 9:19 AM

## 2020-02-17 MED ORDER — ACETAMINOPHEN 325 MG PO TABS: 650.0000 mg | ORAL_TABLET | Freq: Four times a day (QID) | ORAL | 0 refills | Status: AC | PRN

## 2020-02-17 MED ORDER — ENALAPRIL MALEATE 5 MG PO TABS: 5.0000 mg | ORAL_TABLET | Freq: Every day | ORAL | 0 refills | Status: DC

## 2020-02-17 MED ORDER — SENNOSIDES-DOCUSATE SODIUM 8.6-50 MG PO TABS: 2.0000 | ORAL_TABLET | ORAL | 0 refills | Status: DC

## 2020-02-17 MED ORDER — IBUPROFEN 800 MG PO TABS
800.0000 mg | ORAL_TABLET | Freq: Three times a day (TID) | ORAL | 0 refills | Status: AC | PRN
Start: 2020-02-17 — End: ?

## 2020-02-17 MED ORDER — OXYCODONE HCL 5 MG PO TABS: 5.0000 mg | ORAL_TABLET | ORAL | 0 refills | Status: DC | PRN

## 2020-02-17 MED ORDER — ENALAPRIL MALEATE 5 MG PO TABS
5.0000 mg | ORAL_TABLET | Freq: Every day | ORAL | Status: DC
  Administered 2020-02-17: 5 mg via ORAL
  Filled 2020-02-17: qty 1

## 2020-02-20 ENCOUNTER — Encounter: Payer: Managed Care, Other (non HMO) | Admitting: Obstetrics & Gynecology

## 2020-02-21 ENCOUNTER — Ambulatory Visit: Payer: Managed Care, Other (non HMO)

## 2020-02-23 ENCOUNTER — Ambulatory Visit: Payer: Managed Care, Other (non HMO)

## 2020-02-24 ENCOUNTER — Ambulatory Visit: Payer: Managed Care, Other (non HMO)

## 2020-03-01 ENCOUNTER — Other Ambulatory Visit (HOSPITAL_COMMUNITY): Admission: RE | Admit: 2020-03-01 | Payer: Managed Care, Other (non HMO) | Source: Ambulatory Visit

## 2020-03-01 ENCOUNTER — Ambulatory Visit (INDEPENDENT_AMBULATORY_CARE_PROVIDER_SITE_OTHER): Payer: Managed Care, Other (non HMO)

## 2020-03-01 VITALS — BP 138/88 | HR 81 | Wt 211.0 lb

## 2020-03-01 DIAGNOSIS — Z0130 Encounter for examination of blood pressure without abnormal findings: Secondary | ICD-10-CM

## 2020-03-01 DIAGNOSIS — Z5189 Encounter for other specified aftercare: Secondary | ICD-10-CM

## 2020-03-01 NOTE — Progress Notes (Signed)
Subjective:     Loretta Schmitt is a 29 y.o. female who presents to the clinic 2 weeks status post LTCS for Incision Check. Eating a regular diet without difficulty. Bowel movements are normal. The patient is not having any pain.   Review of Systems Pertinent items are noted in HPI.    Objective:    BP 138/88 (BP Location: Right Arm, Cuff Size: Large)   Pulse 81   Wt 211 lb (95.7 kg)   LMP 06/01/2019   BMI 36.22 kg/m  General:  alert  Abdomen: soft, bowel sounds active, non-tender  Incision:   healing well, no drainage, no erythema, no hernia, no seroma, no swelling, no dehiscence, incision well approximated     Assessment:    Doing well postoperatively.   Plan:    1. Continue any current medications. 2. Wound care discussed. 3. Activity restrictions: none 4. Anticipated return to work: not applicable. 5. Follow up: 2 weeks for PP visit per Dr. Alysia Penna.   Maretta Bees, RMA

## 2020-03-01 NOTE — Progress Notes (Signed)
Subjective:  Loretta Schmitt is a 29 y.o. female here for BP check 2 weeks Postpartum.   Hypertension ROS: taking medications as instructed, no medication side effects noted, no TIA's, no chest pain on exertion, no dyspnea on exertion and no swelling of ankles.    Objective:  BP 138/88 (BP Location: Right Arm, Cuff Size: Large)   Pulse 81   Wt 211 lb (95.7 kg)   LMP 06/01/2019   BMI 36.22 kg/m   Appearance alert, well appearing, and in no distress. General exam BP noted to be well controlled today in office.    Assessment:   Blood Pressure stable.   Plan:  Current treatment plan is effective, no change in therapy.  Continue taking medications as directed. Return in 2 weeks for In Person Postpartum visit per Dr. Alysia Penna.

## 2020-03-01 NOTE — Progress Notes (Signed)
Agree with A & P. 

## 2020-03-03 ENCOUNTER — Encounter (HOSPITAL_COMMUNITY): Admission: RE | Payer: Self-pay | Source: Ambulatory Visit

## 2020-03-03 ENCOUNTER — Inpatient Hospital Stay (HOSPITAL_COMMUNITY)
Admission: RE | Admit: 2020-03-03 | Payer: Managed Care, Other (non HMO) | Source: Ambulatory Visit | Admitting: Obstetrics and Gynecology

## 2020-03-03 SURGERY — Surgical Case
Anesthesia: Regional

## 2020-03-07 ENCOUNTER — Telehealth: Payer: Self-pay

## 2020-03-07 NOTE — Telephone Encounter (Signed)
S/w pt and advised of FMLA turn around time of 7-10 business days.

## 2020-03-08 DIAGNOSIS — Z3493 Encounter for supervision of normal pregnancy, unspecified, third trimester: Secondary | ICD-10-CM

## 2020-03-14 ENCOUNTER — Ambulatory Visit (INDEPENDENT_AMBULATORY_CARE_PROVIDER_SITE_OTHER): Payer: Managed Care, Other (non HMO)

## 2020-03-28 ENCOUNTER — Other Ambulatory Visit: Payer: Managed Care, Other (non HMO)

## 2020-03-28 ENCOUNTER — Ambulatory Visit: Payer: Managed Care, Other (non HMO) | Admitting: Obstetrics & Gynecology

## 2020-04-12 ENCOUNTER — Other Ambulatory Visit: Payer: Managed Care, Other (non HMO)

## 2020-04-12 ENCOUNTER — Ambulatory Visit: Payer: Managed Care, Other (non HMO) | Admitting: Obstetrics

## 2020-05-02 ENCOUNTER — Ambulatory Visit: Payer: Managed Care, Other (non HMO) | Admitting: Obstetrics

## 2020-05-02 ENCOUNTER — Other Ambulatory Visit: Payer: Managed Care, Other (non HMO)

## 2020-05-08 ENCOUNTER — Other Ambulatory Visit: Payer: Self-pay

## 2020-05-08 ENCOUNTER — Ambulatory Visit (INDEPENDENT_AMBULATORY_CARE_PROVIDER_SITE_OTHER): Payer: Managed Care, Other (non HMO) | Admitting: Obstetrics

## 2020-05-08 VITALS — BP 114/72 | HR 85 | Ht 63.0 in | Wt 194.8 lb

## 2020-05-08 DIAGNOSIS — N946 Dysmenorrhea, unspecified: Secondary | ICD-10-CM

## 2020-05-08 DIAGNOSIS — Z30011 Encounter for initial prescription of contraceptive pills: Secondary | ICD-10-CM

## 2020-05-08 DIAGNOSIS — Z3009 Encounter for other general counseling and advice on contraception: Secondary | ICD-10-CM

## 2020-05-08 MED ORDER — NAPROXEN 500 MG PO TABS: 500.0000 mg | ORAL_TABLET | Freq: Two times a day (BID) | ORAL | 5 refills | Status: DC

## 2020-05-08 MED ORDER — NORETHIN ACE-ETH ESTRAD-FE 1-20 MG-MCG(24) PO TABS: 1.0000 | ORAL_TABLET | Freq: Every day | ORAL | 11 refills | Status: DC

## 2020-05-08 NOTE — Progress Notes (Signed)
Post Partum Visit Note  Rilley Poulter is a 29 y.o. [id number] female who presents for a postpartum visit. She is 11 weeks postpartum following a normal spontaneous vaginal delivery, primary cesarean section and repeat cesarean section.  I have fully reviewed the prenatal and intrapartum course. The delivery was at 37 gestational weeks.  Anesthesia: spinal. Postpartum course has been uncomplications. Baby is doing well. Baby is feeding by bottle - Similac Advance. Bleeding no bleeding. Bowel function is normal. Bladder function is normal. Patient is not sexually active. Contraception method is none. Postpartum depression screening: negative.   The pregnancy intention screening data noted above was reviewed. Potential methods of contraception were discussed. The patient elected to proceed with OCP's.    Edinburgh Postnatal Depression Scale - 05/08/20 1623      Edinburgh Postnatal Depression Scale:  In the Past 7 Days   I have been able to laugh and see the funny side of things. 0    I have looked forward with enjoyment to things. 0    I have blamed myself unnecessarily when things went wrong. 0    I have been anxious or worried for no good reason. 0    I have felt scared or panicky for no good reason. 0    Things have been getting on top of me. 0    I have been so unhappy that I have had difficulty sleeping. 0    I have felt sad or miserable. 0    I have been so unhappy that I have been crying. 0    The thought of harming myself has occurred to me. 0    Edinburgh Postnatal Depression Scale Total 0            The following portions of the patient's history were reviewed and updated as appropriate: allergies, current medications, past family history, past medical history, past social history, past surgical history and problem list.  Review of Systems A comprehensive review of systems was negative.    Objective:  Blood pressure 114/72, pulse 85, height 5\' 3"  (1.6 m), weight 194 lb  12.8 oz (88.4 kg), last menstrual period 05/06/2020, not currently breastfeeding.  General:  alert and no distress   Breasts:  inspection negative, no nipple discharge or bleeding, no masses or nodularity palpable  Lungs: clear to auscultation bilaterally  Heart:  regular rate and rhythm, S1, S2 normal, no murmur, click, rub or gallop  Abdomen: soft, non-tender; bowel sounds normal; no masses,  no organomegaly and incision is clean, dry and intact.     Assessment:    1. Encounter for other general counseling and advice on contraception - wants OCP's  2. Encounter for initial prescription of contraceptive pills Rx: - Norethindrone Acetate-Ethinyl Estrad-FE (LOESTRIN 24 FE) 1-20 MG-MCG(24) tablet; Take 1 tablet by mouth daily.  Dispense: 28 tablet; Refill: 11  3. Dysmenorrhea Rx: - naproxen (NAPROSYN) 500 MG tablet; Take 1 tablet (500 mg total) by mouth 2 (two) times daily with a meal.  Dispense: 30 tablet; Refill: 5   Plan:   Essential components of care per ACOG recommendations:  1.  Mood and well being: Patient with negative depression screening today. Reviewed local resources for support.  - Patient does not use tobacco.  - hx of drug use? No    2. Infant care and feeding:  -Patient currently breastmilk feeding? No  -Social determinants of health (SDOH) reviewed in EPIC. No concerns  3. Sexuality, contraception and birth spacing - Patient  does not want a pregnancy in the next year.  Desired family size is 3 children.  - Reviewed forms of contraception in tiered fashion. Patient desired oral contraceptives (estrogen/progesterone) today.   - Discussed birth spacing of 18 months  4. Sleep and fatigue -Encouraged family/partner/community support of 4 hrs of uninterrupted sleep to help with mood and fatigue  5. Physical Recovery  - Discussed patients delivery - Patient has urinary incontinence? No - Patient is safe to resume physical and sexual activity  6.  Health  Maintenance - Last pap smear done in 2020 and was normal  7. No Chronic Disease - PCP follow up   Coral Ceo, MD [facility], [facility] 05/08/20

## 2020-05-22 ENCOUNTER — Encounter: Payer: Self-pay | Admitting: Obstetrics

## 2020-05-22 ENCOUNTER — Ambulatory Visit (INDEPENDENT_AMBULATORY_CARE_PROVIDER_SITE_OTHER): Payer: Managed Care, Other (non HMO) | Admitting: Obstetrics

## 2020-05-22 ENCOUNTER — Other Ambulatory Visit: Payer: Self-pay

## 2020-05-22 ENCOUNTER — Other Ambulatory Visit (HOSPITAL_COMMUNITY)
Admission: RE | Admit: 2020-05-22 | Discharge: 2020-05-22 | Disposition: A | Discharge status: Home / Self Care | Payer: Managed Care, Other (non HMO) | Source: Ambulatory Visit | Attending: Obstetrics | Admitting: Obstetrics

## 2020-05-22 VITALS — BP 127/84 | HR 74 | Ht 63.0 in | Wt 196.0 lb

## 2020-05-22 DIAGNOSIS — Z3041 Encounter for surveillance of contraceptive pills: Secondary | ICD-10-CM

## 2020-05-22 DIAGNOSIS — Z3009 Encounter for other general counseling and advice on contraception: Secondary | ICD-10-CM

## 2020-05-22 DIAGNOSIS — N898 Other specified noninflammatory disorders of vagina: Secondary | ICD-10-CM | POA: Diagnosis present

## 2020-05-22 DIAGNOSIS — N760 Acute vaginitis: Secondary | ICD-10-CM | POA: Diagnosis not present

## 2020-05-22 DIAGNOSIS — Z01419 Encounter for gynecological examination (general) (routine) without abnormal findings: Secondary | ICD-10-CM | POA: Diagnosis not present

## 2020-05-22 MED ORDER — LO LOESTRIN FE 1 MG-10 MCG / 10 MCG PO TABS: 1.0000 | ORAL_TABLET | Freq: Every day | ORAL | 4 refills | Status: DC

## 2020-05-22 NOTE — Progress Notes (Signed)
GYN presents for AEX/PAP.  Reports no problems today. 

## 2020-05-22 NOTE — Progress Notes (Signed)
Subjective:        Loretta Schmitt is a 29 y.o. female here for a routine exam.  Current complaints: Vaginal discharge with odor and irritation.    Personal health questionnaire:  Is patient Ashkenazi Jewish, have a family history of breast and/or ovarian cancer: no Is there a family history of uterine cancer diagnosed at age < 42, gastrointestinal cancer, urinary tract cancer, family member who is a Field seismologist syndrome-associated carrier: no Is the patient overweight and hypertensive, family history of diabetes, personal history of gestational diabetes, preeclampsia or PCOS: no Is patient over 18, have PCOS,  family history of premature CHD under age 98, diabetes, smoke, have hypertension or peripheral artery disease:  no At any time, has a partner hit, kicked or otherwise hurt or frightened you?: no Over the past 2 weeks, have you felt down, depressed or hopeless?: no Over the past 2 weeks, have you felt little interest or pleasure in doing things?:no   Gynecologic History Patient's last menstrual period was 05/06/2020. Contraception: OCP (estrogen/progesterone) Last Pap: unknown. Results were: unknown Last mammogram: n/a. Results were: n/a  Obstetric History OB History  Gravida Para Term Preterm AB Living  '3 2 1 1 1 2  ' SAB TAB Ectopic Multiple Live Births    1   0 2    # Outcome Date GA Lbr Len/2nd Weight Sex Delivery Anes PTL Lv  3 Term 02/15/20 [redacted]w[redacted]d 9 lb 4.9 oz (4.22 kg) M CS-Vac Spinal  LIV  2 TAB 01/05/15          1 Preterm 02/26/14 222w0d M CS-LTranv   LIV    Past Medical History:  Diagnosis Date   Anemia    Asthma    Scoliosis     Past Surgical History:  Procedure Laterality Date   CESAREAN SECTION     CESAREAN SECTION N/A 02/15/2020   Procedure: CESAREAN SECTION;  Surgeon: NeCaren MacadamMD;  Location: [facility] LD ORS;  Service: Obstetrics;  Laterality: N/A;     Current Outpatient Medications:    acetaminophen (TYLENOL) 325 MG tablet, Take 2  tablets (650 mg total) by mouth every 6 (six) hours as needed for mild pain (temperature > 101.5.)., Disp: 30 tablet, Rfl: 0   albuterol (VENTOLIN HFA) 108 (90 Base) MCG/ACT inhaler, Inhale 2 puffs into the lungs every 6 (six) hours as needed for wheezing or shortness of breath., Disp: 6.7 g, Rfl: 0   Blood Pressure Monitoring (BLOOD PRESSURE KIT) DEVI, 1 kit by Does not apply route as needed., Disp: 1 each, Rfl: 0   enalapril (VASOTEC) 5 MG tablet, Take 1 tablet (5 mg total) by mouth daily. (Patient not taking: Reported on 05/08/2020), Disp: 90 tablet, Rfl: 0   ibuprofen (ADVIL) 800 MG tablet, Take 1 tablet (800 mg total) by mouth every 8 (eight) hours as needed., Disp: 30 tablet, Rfl: 0   LO LOESTRIN FE 1 MG-10 MCG / 10 MCG tablet, Take 1 tablet by mouth daily., Disp: 84 tablet, Rfl: 4   naproxen (NAPROSYN) 500 MG tablet, Take 1 tablet (500 mg total) by mouth 2 (two) times daily with a meal., Disp: 30 tablet, Rfl: 5   Norethindrone Acetate-Ethinyl Estrad-FE (LOESTRIN 24 FE) 1-20 MG-MCG(24) tablet, Take 1 tablet by mouth daily., Disp: 28 tablet, Rfl: 11 No Known Allergies  Social History   Tobacco Use   Smoking status: Former Smoker    Quit date: 10/06/2012    Years since quitting: 7.6   Smokeless tobacco:  Never Used  Substance Use Topics   Alcohol use: No    History reviewed. No pertinent family history.    Review of Systems  Constitutional: negative for fatigue and weight loss Respiratory: negative for cough and wheezing Cardiovascular: negative for chest pain, fatigue and palpitations Gastrointestinal: negative for abdominal pain and change in bowel habits Musculoskeletal:negative for myalgias Neurological: negative for gait problems and tremors Behavioral/Psych: negative for abusive relationship, depression Endocrine: negative for temperature intolerance    Genitourinary:negative for abnormal menstrual periods, genital lesions, hot flashes, sexual problems.  Positive for  vaginal discharge vaginal discharge Integument/breast: negative for breast lump, breast tenderness, nipple discharge and skin lesion(s)    Objective:       BP 127/84    Pulse 74    Ht '5\' 3"'  (1.6 m)    Wt 196 lb (88.9 kg)    LMP 05/06/2020    BMI 34.72 kg/m  General:   alert  Skin:   no rash or abnormalities  Lungs:   clear to auscultation bilaterally  Heart:   regular rate and rhythm, S1, S2 normal, no murmur, click, rub or gallop  Breasts:   normal without suspicious masses, skin or nipple changes or axillary nodes  Abdomen:  normal findings: no organomegaly, soft, non-tender and no hernia  Pelvis:  External genitalia: normal general appearance Urinary system: urethral meatus normal and bladder without fullness, nontender Vaginal: normal without tenderness, induration or masses Cervix: normal appearance Adnexa: normal bimanual exam Uterus: anteverted and non-tender, normal size   Lab Review Urine pregnancy test Labs reviewed yes Radiologic studies reviewed no  50% of 20 min visit spent on counseling and coordination of care.   Assessment:     1. Encounter for routine gynecological examination with Papanicolaou smear of cervix Rx: - Cytology - PAP( [facility])  2. Vaginal discharge Rx: - Cervicovaginal ancillary only( [facility])  3. Encounter for other general counseling and advice on contraception - wants to continue OCP's  4. Encounter for surveillance of contraceptive pills - pleased with Lo Loestrin 24 Fe    Plan:    Education reviewed: calcium supplements, depression evaluation, low fat, low cholesterol diet, safe sex/STD prevention, self breast exams and weight bearing exercise. Contraception: OCP (estrogen/progesterone). Follow up in: 1 year.   Meds ordered this encounter  Medications   LO LOESTRIN FE 1 MG-10 MCG / 10 MCG tablet    Sig: Take 1 tablet by mouth daily.    Dispense:  84 tablet    Refill:  4    Submit other coverage code 3  BIN:  [id number]   PCN:  [id number]   GRP:  [id number]   ID:  [id number]     Shelly Bombard, MD 05/22/2020 3:52 PM

## 2020-05-23 LAB — CERVICOVAGINAL ANCILLARY ONLY
Bacterial Vaginitis (gardnerella): POSITIVE — AB
Candida Glabrata: NEGATIVE
Candida Vaginitis: NEGATIVE
Chlamydia: NEGATIVE
Comment: NEGATIVE
Comment: NEGATIVE
Comment: NEGATIVE
Comment: NEGATIVE
Comment: NEGATIVE
Comment: NORMAL
Neisseria Gonorrhea: NEGATIVE
Trichomonas: NEGATIVE

## 2020-05-24 LAB — CYTOLOGY - PAP: Diagnosis: NEGATIVE

## 2020-05-29 ENCOUNTER — Other Ambulatory Visit: Payer: Self-pay | Admitting: Obstetrics

## 2020-05-29 DIAGNOSIS — B9689 Other specified bacterial agents as the cause of diseases classified elsewhere: Secondary | ICD-10-CM

## 2020-05-29 MED ORDER — METRONIDAZOLE 500 MG PO TABS: 500.0000 mg | ORAL_TABLET | Freq: Two times a day (BID) | ORAL | 2 refills | Status: AC

## 2021-03-19 ENCOUNTER — Ambulatory Visit: Payer: Managed Care, Other (non HMO) | Admitting: Obstetrics

## 2021-05-13 IMAGING — US US MFM OB DETAIL+14 WK
1 series · 13 of 28 positions shown · non-contrast
Comparison: none

[Series 1: us mfm ob detail+14 wk · 13 of 125 slices shown]
[im 5/125]
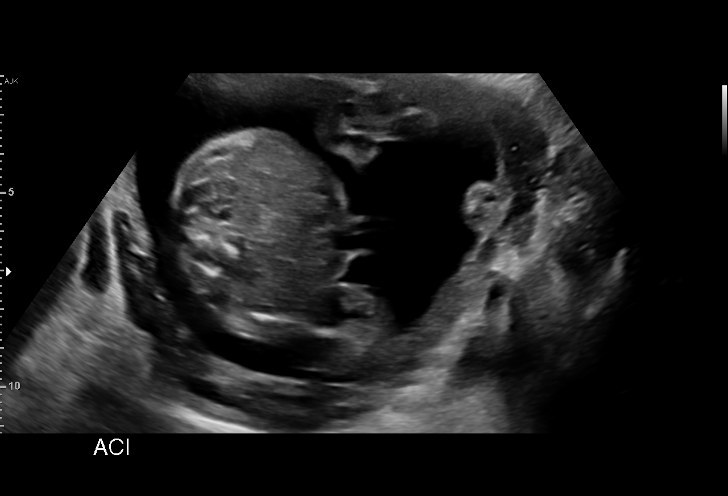
[im 14/125]
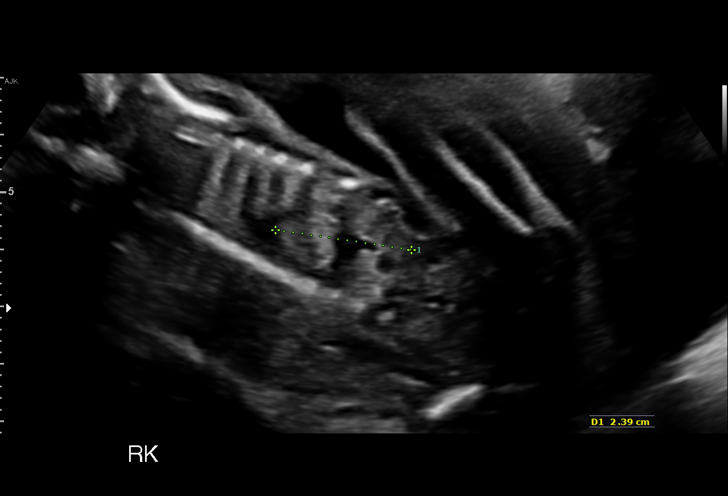
[im 23/125]
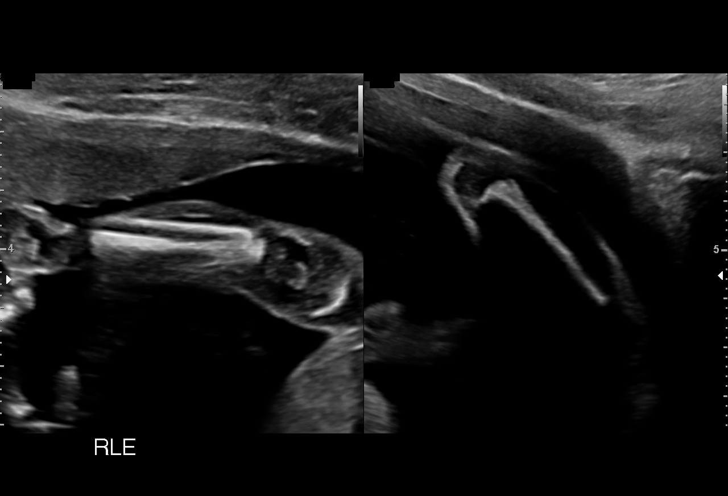
[im 33/125]
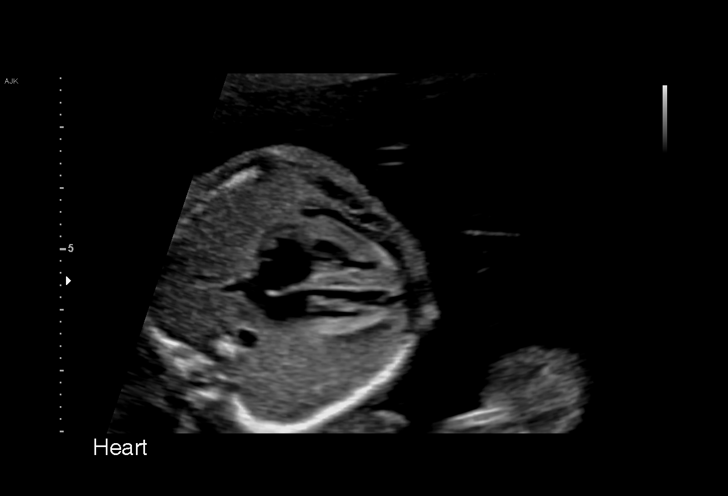
[im 42/125]
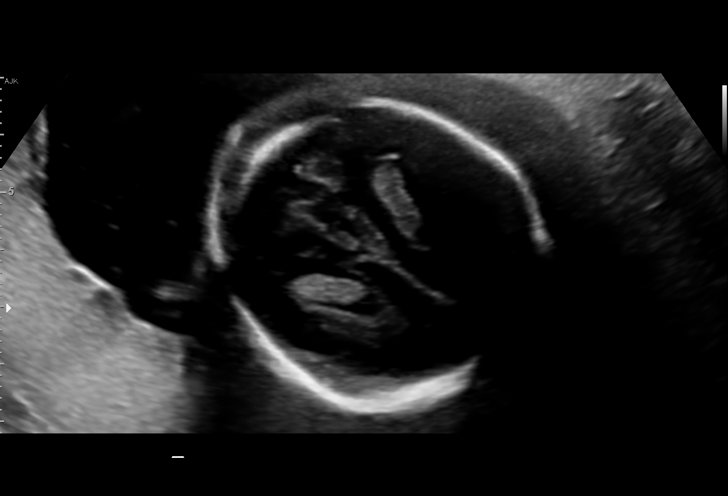
[im 51/125]
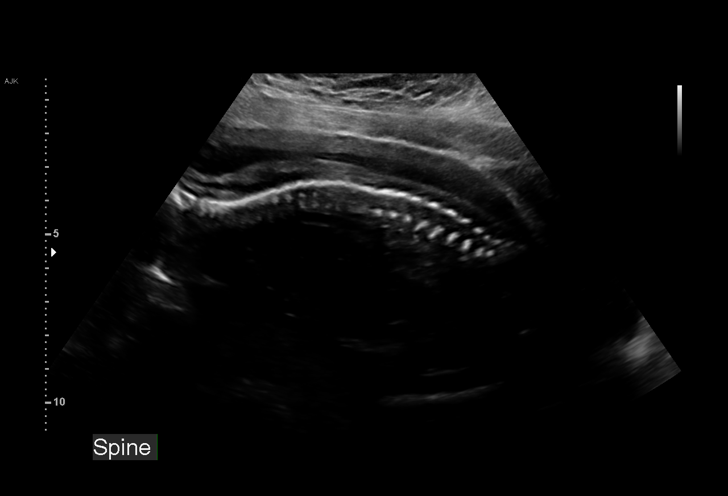
[im 65/125]
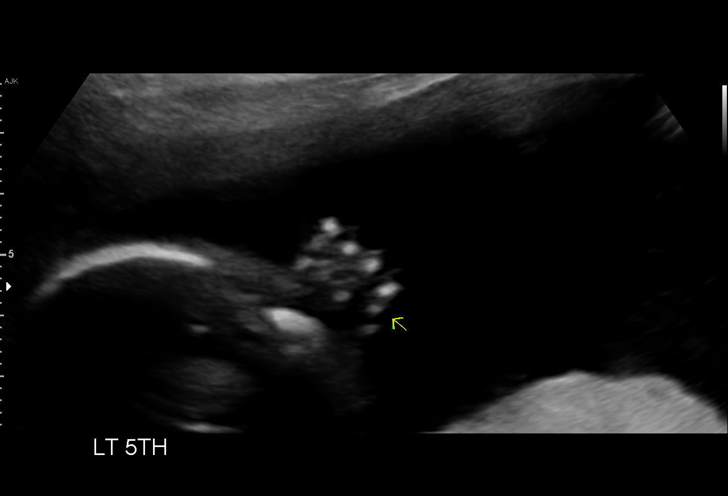
[im 74/125]
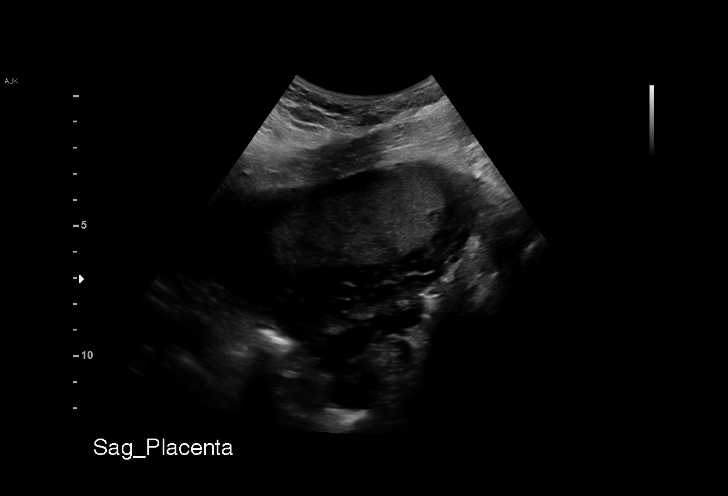
[im 83/125]
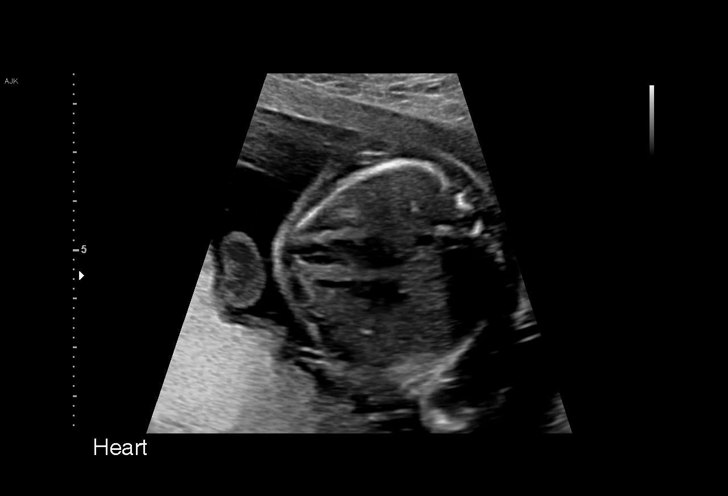
[im 92/125]
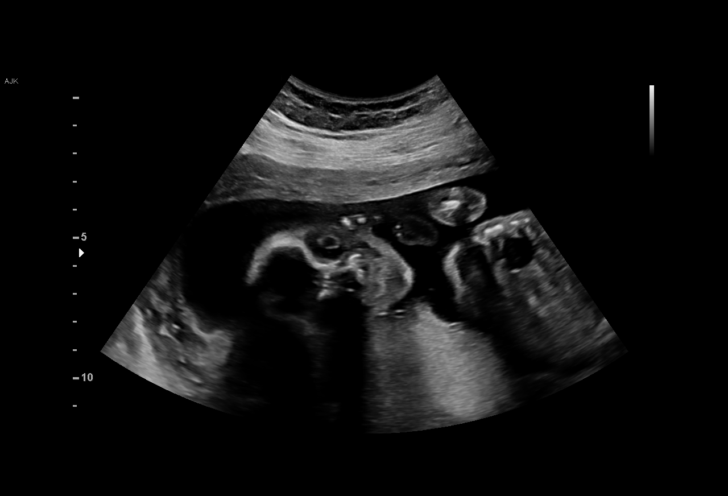
[im 102/125]
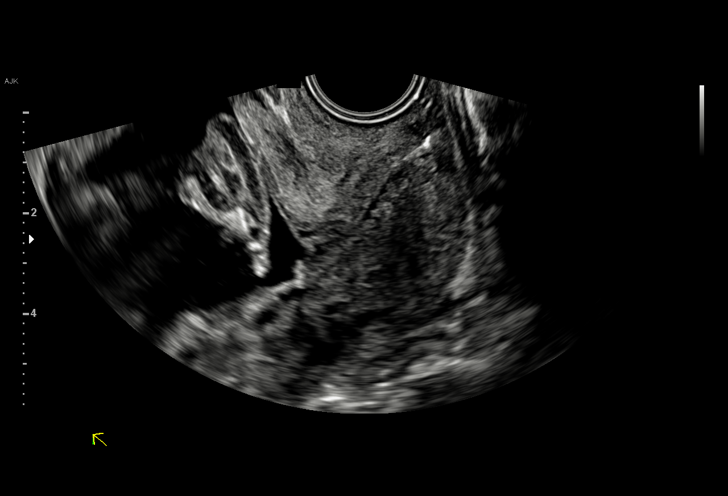
[im 111/125]
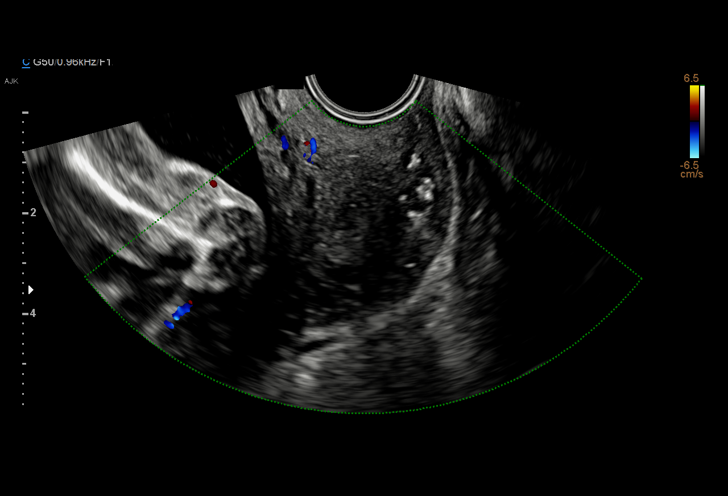
[im 120/125]
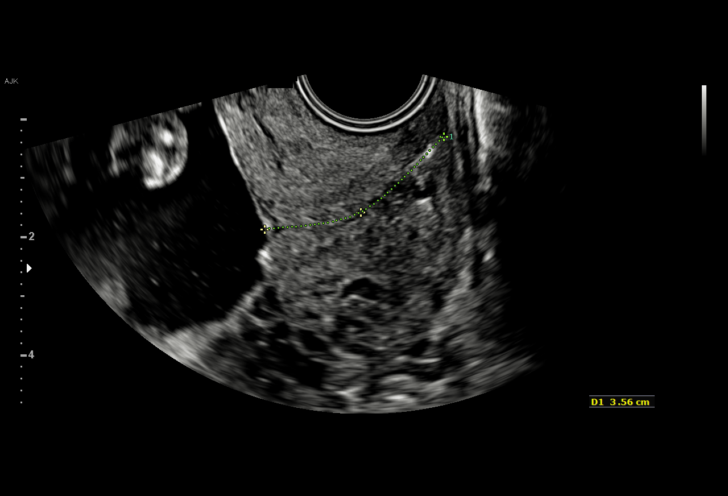

[13 of 28 positions shown; findings below may reference images not displayed]

ALEXANDRE DE

                                                       MAE
                                                       MAE
 ----------------------------------------------------------------------

 ----------------------------------------------------------------------
Indications

  21 weeks gestation of pregnancy
  Obesity complicating pregnancy, second
  trimester
  Poor obstetric history: Previous preterm
  delivery, antepartum ([place])
  Previous cesarean delivery, antepartum
  Encounter for antenatal screening for
  malformations (neg AFP, Neg horizon)
 ----------------------------------------------------------------------
Fetal Evaluation

 Num Of Fetuses:         1
 Fetal Heart Rate(bpm):  157
 Cardiac Activity:       Observed
 Presentation:           Breech
 Placenta:               Posterior
 P. Cord Insertion:      Visualized

 Amniotic Fluid
 AFI FV:      Within normal limits

                             Largest Pocket(cm)

Biometry

 BPD:      52.7  mm     G. Age:  22w 0d         80  %    CI:        75.38   %    70 - 86
                                                         FL/HC:      17.6   %    15.9 -
 HC:      192.5  mm     G. Age:  21w 3d         56  %    HC/AC:      1.11        1.06 -
 AC:       173   mm     G. Age:  22w 2d         78  %    FL/BPD:     64.3   %
 FL:       33.9  mm     G. Age:  20w 5d         24  %    FL/AC:      19.6   %    20 - 24
 HUM:      32.2  mm     G. Age:  20w 5d         38  %
 CER:      23.5  mm     G. Age:  21w 5d         68  %

 LV:        7.6  mm
 CM:        5.3  mm

 Est. FW:     432  gm    0 lb 15 oz      67  %
OB History

 Gravidity:    3         Prem:   1
 TOP:          1
Gestational Age

 LMP:           21w 1d        Date:  06/01/19                 EDD:   03/07/20
 U/S Today:     21w 4d                                        EDD:   03/04/20
 Best:          21w 1d     Det. By:  LMP  (06/01/19)          EDD:   03/07/20
Anatomy

 Cranium:               Appears normal         LVOT:                   Appears normal
 Cavum:                 Appears normal         Aortic Arch:            Appears normal
 Ventricles:            Appears normal         Ductal Arch:            Not well visualized
 Choroid Plexus:        Appears normal         Diaphragm:              Appears normal
 Cerebellum:            Appears normal         Stomach:                Appears normal, left
                                                                       sided
 Posterior Fossa:       Appears normal         Abdomen:                Appears normal
 Nuchal Fold:           Not applicable (>20    Abdominal Wall:         Appears nml (cord
                        wks GA)                                        insert, abd wall)
 Face:                  Not well visualized    Cord Vessels:           Appears normal (3
                                                                       vessel cord)
 Lips:                  Appears normal         Kidneys:                Appear normal
 Palate:                Not well visualized    Bladder:                Appears normal
 Thoracic:              Appears normal         Spine:                  Appears normal
 Heart:                 Appears normal         Upper Extremities:      Appears normal
                        (4CH, axis, and
                        situs)
 RVOT:                  Not well visualized    Lower Extremities:      Appears normal

 Other:  Fetus appears to be a male. 5th digit visualized. Technically difficult
         due to maternal habitus and fetal position.
Cervix Uterus Adnexa

 Cervix
 Length:            3.3  cm.
 Normal appearance by transvaginal scan

 Uterus
 No abnormality visualized.

 Left Ovary
 Not visualized.

 Right Ovary
 Not visualized.
 Adnexa
 No abnormality visualized.
Impression

 Patient is here for fetal anatomy scan.  Obstetric history is
 significant for preterm cesarean delivery at 28 weeks
 gestation.  Patient went into spontaneous labor.  She takes
 weekly progesterone injections.

 We performed fetal anatomy scan. No makers of
 aneuploidies or fetal structural defects are seen. Fetal
 biometry is consistent with her previously-established dates.
 Amniotic fluid is normal and good fetal activity is seen.
 Patient understands the limitations of ultrasound in detecting
 fetal anomalies.
 Placenta is posterior and there is no evidence of previa or
 accreta.
 Because of a history of preterm delivery, we performed a
 transvaginal ultrasound to evaluate the cervical length.  The
 shortest cervical length measurement is 3 cm, which is within
 normal limits.  No shortening or funneling is seen on
 transfundal pressure.

 We reassured the patient of the findings.  Patient reports she
 had screening for fetal aneuploidies (?  Pending results).
Recommendations

 -An appointment was made for her to return in 4 weeks for
 completion of fetal anatomy.
                 Axovi, Anagbla

## 2021-05-18 ENCOUNTER — Emergency Department (HOSPITAL_COMMUNITY)
Admission: EM | Admit: 2021-05-18 | Discharge: 2021-05-18 | Disposition: A | Discharge status: Home / Self Care | Payer: Managed Care, Other (non HMO) | Attending: Emergency Medicine | Admitting: Emergency Medicine

## 2021-05-18 ENCOUNTER — Encounter (HOSPITAL_COMMUNITY): Payer: Self-pay | Admitting: Emergency Medicine

## 2021-05-18 ENCOUNTER — Emergency Department (HOSPITAL_COMMUNITY): Payer: Managed Care, Other (non HMO)

## 2021-05-18 DIAGNOSIS — S61203A Unspecified open wound of left middle finger without damage to nail, initial encounter: Secondary | ICD-10-CM | POA: Diagnosis not present

## 2021-05-18 DIAGNOSIS — Z87891 Personal history of nicotine dependence: Secondary | ICD-10-CM | POA: Diagnosis not present

## 2021-05-18 DIAGNOSIS — L03012 Cellulitis of left finger: Secondary | ICD-10-CM | POA: Insufficient documentation

## 2021-05-18 DIAGNOSIS — R21 Rash and other nonspecific skin eruption: Secondary | ICD-10-CM | POA: Diagnosis not present

## 2021-05-18 DIAGNOSIS — M79646 Pain in unspecified finger(s): Secondary | ICD-10-CM | POA: Diagnosis present

## 2021-05-18 MED ORDER — CEPHALEXIN 250 MG PO CAPS
500.0000 mg | ORAL_CAPSULE | Freq: Once | ORAL | Status: AC
  Administered 2021-05-18: 500 mg via ORAL
  Filled 2021-05-18: qty 2

## 2021-05-18 MED ORDER — OXYCODONE-ACETAMINOPHEN 5-325 MG PO TABS
1.0000 | ORAL_TABLET | Freq: Once | ORAL | Status: AC
  Administered 2021-05-18: 1 via ORAL
  Filled 2021-05-18: qty 1

## 2021-05-18 MED ORDER — IBUPROFEN 400 MG PO TABS
600.0000 mg | ORAL_TABLET | Freq: Once | ORAL | Status: AC
  Administered 2021-05-18: 600 mg via ORAL
  Filled 2021-05-18: qty 1

## 2021-05-18 MED ORDER — NAPROXEN 500 MG PO TABS
500.0000 mg | ORAL_TABLET | Freq: Two times a day (BID) | ORAL | 0 refills | Status: AC
Start: 2021-05-18 — End: ?

## 2021-05-18 MED ORDER — CEPHALEXIN 500 MG PO CAPS
500.0000 mg | ORAL_CAPSULE | Freq: Two times a day (BID) | ORAL | 0 refills | Status: AC
Start: 2021-05-18 — End: 2021-05-25

## 2021-05-18 NOTE — ED Provider Notes (Signed)
[place] EMERGENCY DEPARTMENT Provider Note   CSN: [id number] Arrival date & time: 05/18/21  1005     History No chief complaint on file.   Loretta Schmitt is a 30 y.o. female with past medical history of anemia, asthma presenting to the ED for left third digit pain swelling and skin changes.  Symptoms began 1 month ago without trigger.  She has noticed gradually worsening crusting and serous drainage.  She has not tried any medications topically or orally for her symptoms.  Denies any fever or chills.  No prior fracture, dislocations or procedures in the area. Also reports crusted lesions to her left foot between her fifth and fourth webspace.  This has been present for 6 months.  HPI     Past Medical History:  Diagnosis Date   Anemia    Asthma    Scoliosis     Patient Active Problem List   Diagnosis Date Noted   Vacuum-assisted cesarean delivery, delivered, current hospitalization 02/15/2020   NST (non-stress test) nonreactive 02/14/2020   Diet controlled gestational diabetes mellitus (GDM) in third trimester 12/01/2019   Supervision of high risk pregnancy, antepartum 09/20/2019   H/O: C-section 09/12/2019   History of preterm delivery 09/12/2019   Morning sickness 07/25/2019    Past Surgical History:  Procedure Laterality Date   CESAREAN SECTION     CESAREAN SECTION N/A 02/15/2020   Procedure: CESAREAN SECTION;  Surgeon: Caren Macadam, MD;  Location: [facility];  Service: Obstetrics;  Laterality: N/A;     OB History     Gravida  3   Para  2   Term  1   Preterm  1   AB  1   Living  2      SAB      IAB  1   Ectopic      Multiple  0   Live Births  2           No family history on file.  Social History   Tobacco Use   Smoking status: Former    Types: Cigarettes    Quit date: 10/06/2012    Years since quitting: 8.6   Smokeless tobacco: Never  Vaping Use   Vaping Use: Never used  Substance Use Topics    Alcohol use: No   Drug use: No    Home Medications Prior to Admission medications   Medication Sig Start Date End Date Taking? Authorizing Provider  cephALEXin (KEFLEX) 500 MG capsule Take 1 capsule (500 mg total) by mouth 2 (two) times daily for 7 days. 05/18/21 05/25/21 Yes Martavius Lusty, PA-C  naproxen (NAPROSYN) 500 MG tablet Take 1 tablet (500 mg total) by mouth 2 (two) times daily. 05/18/21  Yes Maydelin Deming, PA-C  acetaminophen (TYLENOL) 325 MG tablet Take 2 tablets (650 mg total) by mouth every 6 (six) hours as needed for mild pain (temperature > 101.5.). 02/17/20   Fair, Marin Shutter, MD  albuterol (VENTOLIN HFA) 108 (90 Base) MCG/ACT inhaler Inhale 2 puffs into the lungs every 6 (six) hours as needed for wheezing or shortness of breath. 07/29/19   Laury Deep, CNM  Blood Pressure Monitoring (BLOOD PRESSURE KIT) DEVI 1 kit by Does not apply route as needed. 10/05/19   Lavonia Drafts, MD  enalapril (VASOTEC) 5 MG tablet Take 1 tablet (5 mg total) by mouth daily. Patient not taking: Reported on 05/08/2020 02/17/20   Chauncey Mann, MD  ibuprofen (ADVIL) 800 MG tablet Take 1  tablet (800 mg total) by mouth every 8 (eight) hours as needed. 02/17/20   Fair, Marin Shutter, MD  LO LOESTRIN FE 1 MG-10 MCG / 10 MCG tablet Take 1 tablet by mouth daily. 05/22/20   Shelly Bombard, MD  metroNIDAZOLE (FLAGYL) 500 MG tablet Take 1 tablet (500 mg total) by mouth 2 (two) times daily. 05/29/20   Shelly Bombard, MD  Norethindrone Acetate-Ethinyl Estrad-FE (LOESTRIN 24 FE) 1-20 MG-MCG(24) tablet Take 1 tablet by mouth daily. 05/08/20   Shelly Bombard, MD    Allergies    Patient has no known allergies.  Review of Systems   Review of Systems  Constitutional:  Negative for chills and fever.  Skin:  Positive for wound.   Physical Exam Updated Vital Signs BP 111/69   Pulse 83   Temp 98.3 F (36.8 C)   Resp 14   LMP 05/15/2021   SpO2 95%   Physical Exam Vitals and nursing note reviewed.   Constitutional:      General: She is not in acute distress.    Appearance: She is well-developed. She is not diaphoretic.  HENT:     Head: Normocephalic and atraumatic.  Eyes:     General: No scleral icterus.    Conjunctiva/sclera: Conjunctivae normal.  Pulmonary:     Effort: Pulmonary effort is normal. No respiratory distress.  Musculoskeletal:     Cervical back: Normal range of motion.  Skin:    Findings: Lesion present. No rash.     Comments: Crusting noted around the cuticle area of the left third digit.  No fluctuance noted.  No drainage noted.  2+ radial pulse palpated.  Dry, flaky skin between the webspace of the fifth foot between the fourth and fifth digit.  2+ DP pulse palpated.  Neurological:     Mental Status: She is alert.    ED Results / Procedures / Treatments   Labs (all labs ordered are listed, but only abnormal results are displayed) Labs Reviewed - No data to display  EKG None  Radiology DG Hand Complete Left  Result Date: 05/18/2021 CLINICAL DATA:  Third finger pain. Losing wound on left middle finger, distal tip for 1 month. EXAM: LEFT HAND - COMPLETE 3+ VIEW COMPARISON:  None. FINDINGS: No foreign body identified in the region of the patient's symptoms. No soft tissue gas identified. No bony erosion. No fracture or dislocation. IMPRESSION: No foreign body, bony erosion, or fracture in the region of the patient's symptoms. Electronically Signed   By: Dorise Bullion III M.D.   On: 05/18/2021 11:42    Procedures Procedures   Medications Ordered in ED Medications  oxyCODONE-acetaminophen (PERCOCET/ROXICET) 5-325 MG per tablet 1 tablet (has no administration in time range)  ibuprofen (ADVIL) tablet 600 mg (has no administration in time range)  cephALEXin (KEFLEX) capsule 500 mg (has no administration in time range)    ED Course  I have reviewed the triage vital signs and the nursing notes.  Pertinent labs & imaging results that were available during my  care of the patient were reviewed by me and considered in my medical decision making (see chart for details).    MDM Rules/Calculators/A&P                           30 year old female presenting to the ED for possible infection around the cuticle of her left third digit.  This has been present for about 1 month.  She reports  some serous drainage.  On exam there is some resting noted around this area with serous drainage.  There is no paronychia or felon present.  There is no fluctuance.  She has normal range of motion.  Also reports a rash between the webspace of her fourth and fifth digits of her left foot.  This has been chronic for 6 months.  Will refer to podiatry for this.  Regarding her possible infection will treat with Keflex.  We will have her follow-up with PCP for wound check.  X-rays negative here.  Return precautions given.    Patient is hemodynamically stable, in NAD, and able to ambulate in the ED. Evaluation does not show pathology that would require ongoing emergent intervention or inpatient treatment. I explained the diagnosis to the patient. Pain has been managed and has no complaints prior to discharge. Patient is comfortable with above plan and is stable for discharge at this time. All questions were answered prior to disposition. Strict return precautions for returning to the ED were discussed. Encouraged follow up with PCP.   An After Visit Summary was printed and given to the patient.   Portions of this note were generated with [employer]. Dictation errors may occur despite best attempts at proofreading.  Final Clinical Impression(s) / ED Diagnoses Final diagnoses:  Cellulitis of finger of left hand  Rash and nonspecific skin eruption    Rx / DC Orders ED Discharge Orders          Ordered    cephALEXin (KEFLEX) 500 MG capsule  2 times daily        05/18/21 1149    naproxen (NAPROSYN) 500 MG tablet  2 times daily        05/18/21 [address] PA-C 05/18/21 1152    Gareth Morgan, MD 05/18/21 2236

## 2021-05-18 NOTE — ED Triage Notes (Signed)
Pt reports skin infection to L middle finger x 1 month that is gradually getting worse.  Denies injury.

## 2021-05-18 NOTE — Discharge Instructions (Addendum)
Take the antibiotics as prescribed. Take the pain medicine as needed. You will need to see the foot specialist listed below for further management of your foot symptoms. Keep the areas clean and dry. Return to the ER if you start to experience worsening pain, swelling, injuries, drainage.

## 2021-05-20 ENCOUNTER — Telehealth: Payer: Self-pay

## 2021-05-20 NOTE — Telephone Encounter (Signed)
Transition Care Management Follow-up Telephone Call Date of discharge and from where: 05/18/2021 from [facility] How have you been since you were released from the hospital? Pt stated that the finger is looking and feeling better.  Any questions or concerns? No  Items Reviewed: Did the pt receive and understand the discharge instructions provided? Yes  Medications obtained and verified? Yes  Other? No  Any new allergies since your discharge? No  Dietary orders reviewed? No Do you have support at home? Yes   Functional Questionnaire: (I = Independent and D = Dependent) ADLs: I  Bathing/Dressing- I  Meal Prep- I  Eating- I  Maintaining continence- I  Transferring/Ambulation- I  Managing Meds- I   Follow up appointments reviewed:  PCP Hospital f/u appt confirmed? No   Specialist Hospital f/u appt confirmed? No   Are transportation arrangements needed? No  If their condition worsens, is the pt aware to call PCP or go to the Emergency Dept.? Yes Was the patient provided with contact information for the PCP's office or ED? Yes Was to pt encouraged to call back with questions or concerns? Yes

## 2021-07-22 IMAGING — US US MFM OB FOLLOW-UP
1 series · 13 of 28 positions shown · non-contrast
Comparison: none

[Series 1: us mfm ob follow-up · 51 acquisitions, 13 frames shown]
[im 2/51]
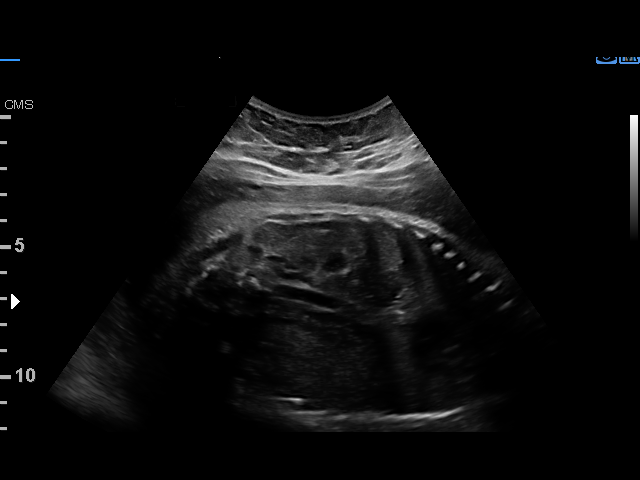
[im 6/51]
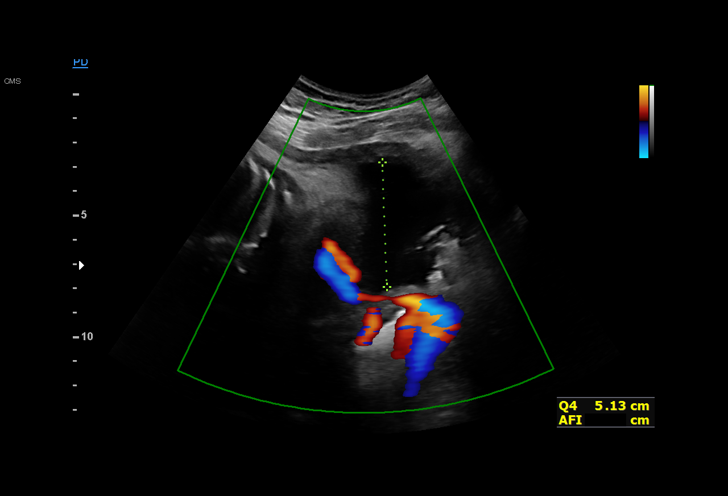
[im 10/51]
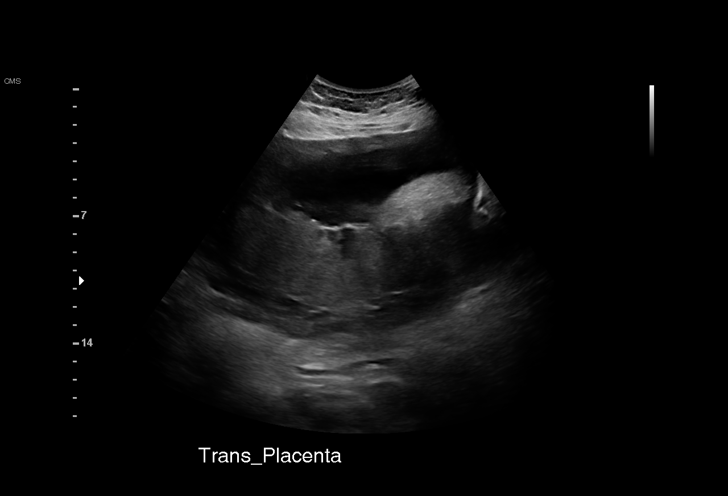
[im 13/51]
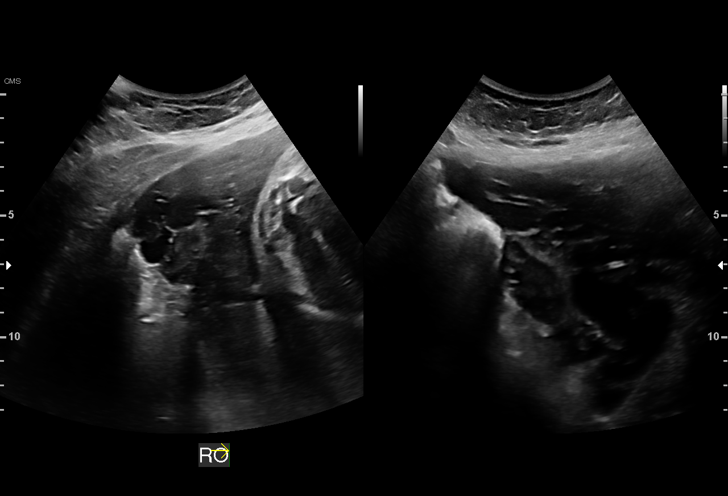
[im 17/51]
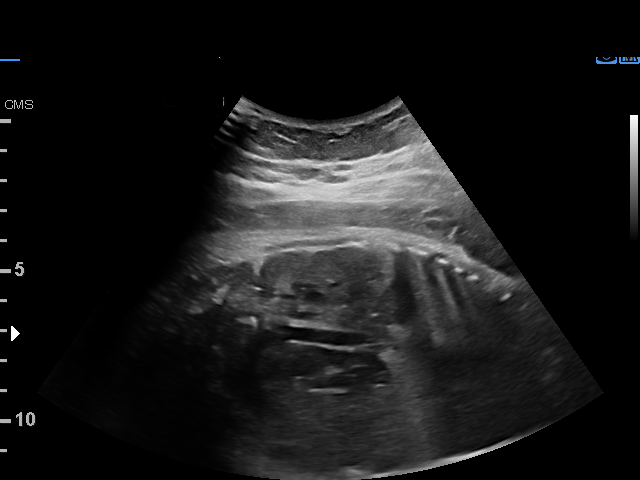
[im 21/51]
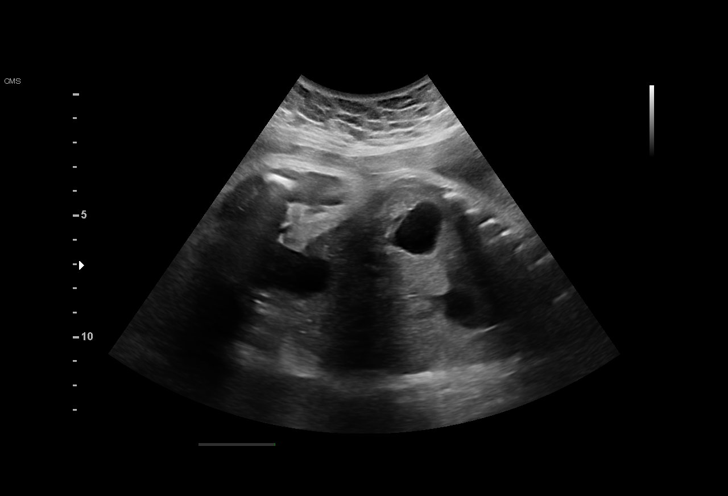
[im 26/51]
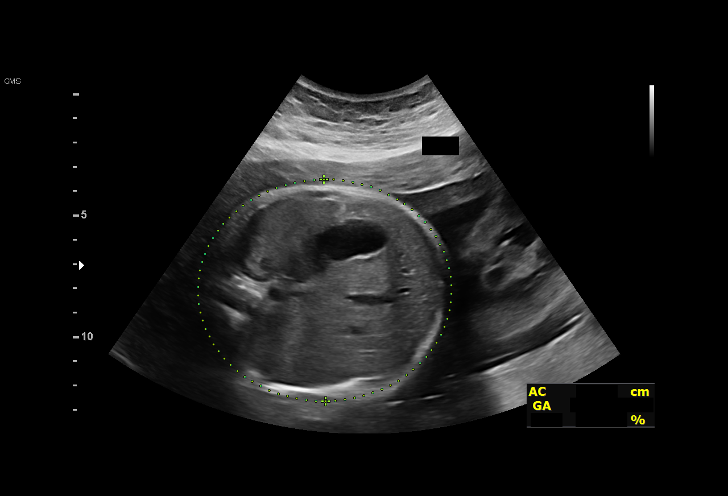
[im 30/51]
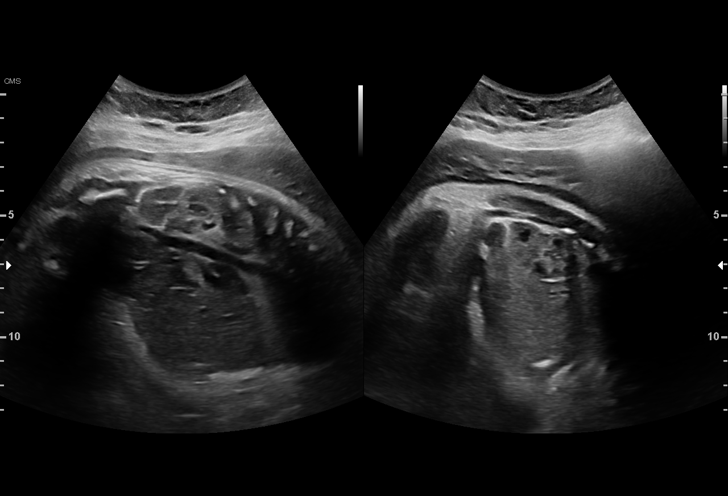
[im 34/51]
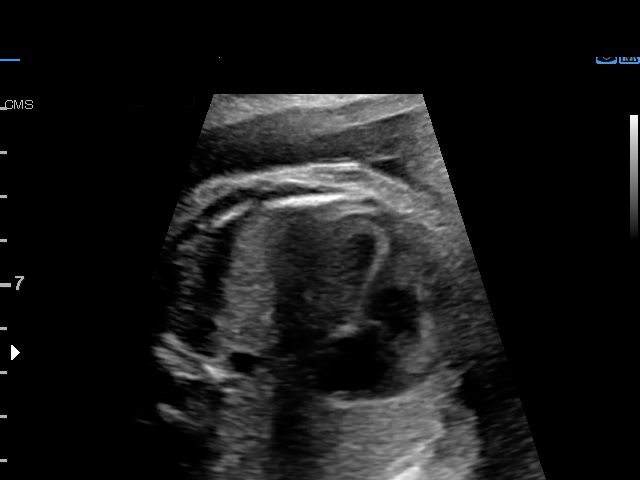
[im 38/51]
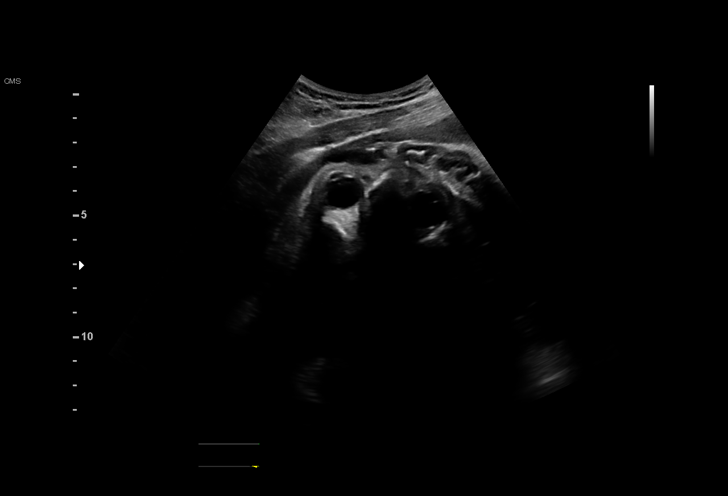
[im 41/51]
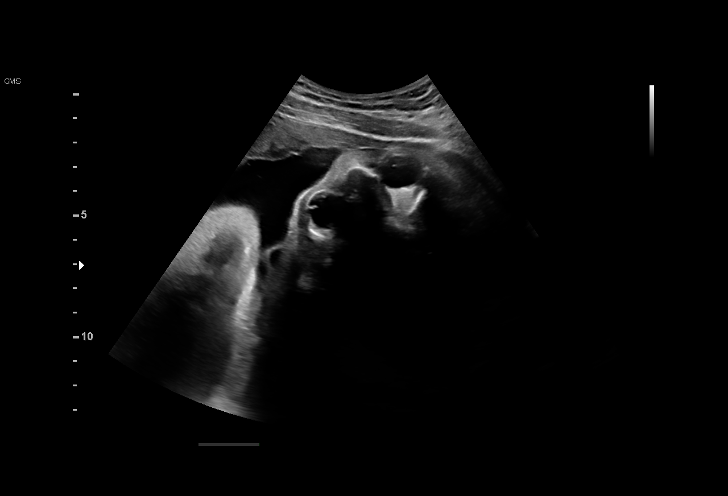
[im 45/51]
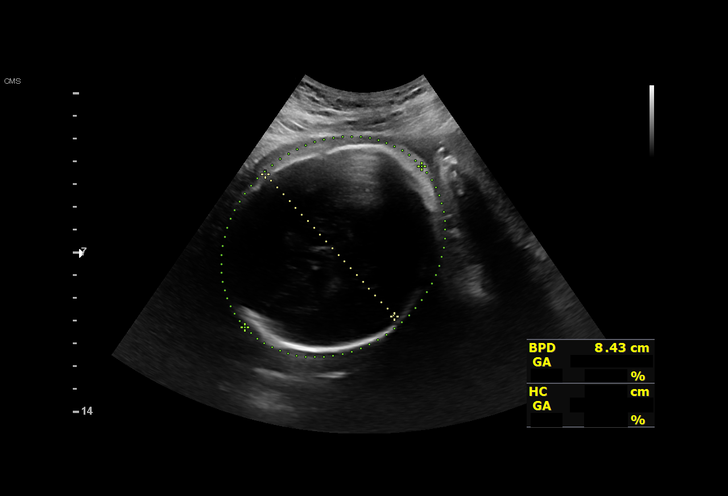
[im 49/51]
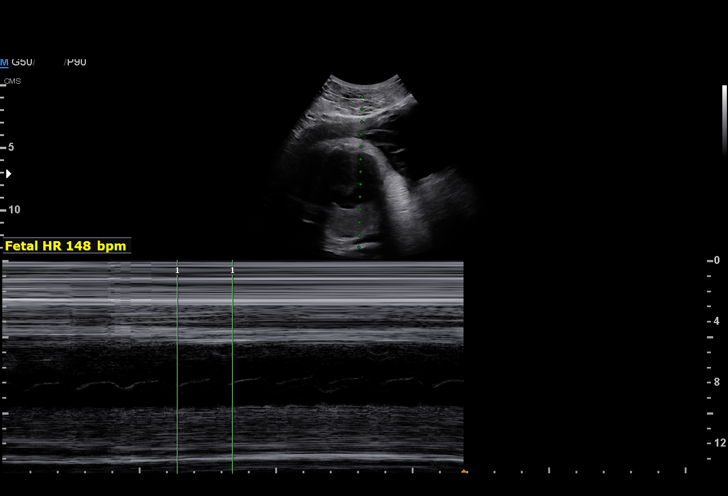

[13 of 28 positions shown; findings below may reference images not displayed]

MAAZOUZI

 ----------------------------------------------------------------------

 ----------------------------------------------------------------------
Indications

  Encounter for other antenatal screening
  follow-up
  Obesity complicating pregnancy, third
  trimester (pregravid BMI 31)
  Poor obstetric history: Previous preterm
  delivery, antepartum ([place]) 28 weeks
  Previous cesarean delivery, antepartum
  31 weeks gestation of pregnancy
  Gestational diabetes in pregnancy, diet
  controlled
 ----------------------------------------------------------------------
Vital Signs

                                                Height:        5'3"
Fetal Evaluation

 Num Of Fetuses:         1
 Fetal Heart Rate(bpm):  148
 Cardiac Activity:       Observed
 Presentation:           Cephalic
 Placenta:               Posterior
 P. Cord Insertion:      Previously Visualized

 Amniotic Fluid
 AFI FV:      Within normal limits

 AFI Sum(cm)     %Tile       Largest Pocket(cm)
 14.72           52

 RUQ(cm)       RLQ(cm)       LUQ(cm)        LLQ(cm)

Biometry

 BPD:      81.7  mm     G. Age:  32w 6d         86  %    CI:        75.48   %    70 - 86
                                                         FL/HC:      19.8   %    19.3 -
 HC:      298.2  mm     G. Age:  33w 0d         66  %    HC/AC:      0.98        0.96 -
 AC:      303.2  mm     G. Age:  34w 2d       > 99  %    FL/BPD:     72.3   %    71 - 87
 FL:       59.1  mm     G. Age:  30w 6d         27  %    FL/AC:      19.5   %    20 - 24
 HUM:        52  mm     G. Age:  30w 2d         36  %

 Est. FW:    3588  gm    4 lb 10 oz      93  %
OB History

 Gravidity:    3         Prem:   1
 TOP:          1
Gestational Age

 LMP:           31w 1d        Date:  06/01/19                 EDD:   03/07/20
 U/S Today:     32w 5d                                        EDD:   02/25/20
 Best:          31w 1d     Det. By:  LMP  (06/01/19)          EDD:   03/07/20
Anatomy

 Cranium:               Appears normal         LVOT:                   Previously seen
 Cavum:                 Previously seen        Aortic Arch:            Previously seen
 Ventricles:            Appears normal         Ductal Arch:            Previously seen
 Choroid Plexus:        Appears normal         Diaphragm:              Appears normal
 Cerebellum:            Previously seen        Stomach:                Appears normal, left
                                                                       sided
 Posterior Fossa:       Previously seen        Abdomen:                Appears normal
 Nuchal Fold:           Previously seen        Abdominal Wall:         Previously seen
 Face:                  Appears normal         Cord Vessels:           Appears normal (3
                        (orbits and profile)                           vessel cord)
 Lips:                  Appears normal         Kidneys:                Appear normal
 Palate:                Not well visualized    Bladder:                Appears normal
 Thoracic:              Appears normal         Spine:                  Previously seen
 Heart:                 Appears normal         Upper Extremities:      Previously seen
                        (4CH, axis, and
                        situs)
 RVOT:                  Previously seen        Lower Extremities:      Previously seen

 Other:  5th digit prev. visualized. Technically difficult due to maternal habitus
         and fetal position.
Cervix Uterus Adnexa

 Cervix
 Not visualized (advanced GA >70wks)

 Uterus
 No abnormality visualized.

 Left Ovary
 Within normal limits.

 Right Ovary
 Within normal limits.
 Cul De Sac
 No free fluid seen.

 Adnexa
 No abnormality visualized.
Impression

 Patient has gestational diabetes.  She is just started checking
 her blood glucose levels.  I am unable to ascertain the control
 of diabetes.
 On ultrasound, the estimated fetal weight is at the 93rd
 percentile.  Amniotic fluid is normal good fetal activity seen.
 I emphasized the importance of good blood glucose control
 to prevent adverse fetal and neonatal outcomes.
Recommendations

 -Fetal growth assessment and BPP in 3 weeks.
 -Weekly BPP from her next visit till delivery.Patient has
 gestational diabetes.  She is just started checking her blood
 glucose levels.  I am unable to ascertain the control of
 diabetes.

                 Borge, Gabor

## 2021-09-18 ENCOUNTER — Other Ambulatory Visit: Payer: Self-pay

## 2021-09-18 ENCOUNTER — Ambulatory Visit
Admission: EM | Admit: 2021-09-18 | Discharge: 2021-09-18 | Disposition: A | Discharge status: Home / Self Care | Payer: Medicaid Other

## 2021-09-18 DIAGNOSIS — H53141 Visual discomfort, right eye: Secondary | ICD-10-CM

## 2021-09-18 DIAGNOSIS — H5711 Ocular pain, right eye: Secondary | ICD-10-CM

## 2021-09-18 NOTE — ED Triage Notes (Signed)
Pt states yesterday her contacts were irritating her right eye so she removed it. She states the eye is sensitive to light and has crusting.

## 2021-09-18 NOTE — ED Notes (Signed)
Patient is being discharged from the Urgent Care and sent to the Emergency Department via POA. Per L. Romero Liner, patient is in need of higher level of care due to Further evlauation. Patient is aware and verbalizes understanding of plan of care.  Vitals:   09/18/21 1605  BP: 126/84  Pulse: 97  Resp: 18  Temp: 99 F (37.2 C)  SpO2: 98%

## 2021-09-18 NOTE — Discharge Instructions (Addendum)
Please go to emergency room now for emergency evaluation of right eye pain

## 2022-02-18 ENCOUNTER — Encounter: Payer: Self-pay | Admitting: Obstetrics

## 2022-02-18 ENCOUNTER — Telehealth (INDEPENDENT_AMBULATORY_CARE_PROVIDER_SITE_OTHER): Payer: Medicaid Other | Admitting: Obstetrics

## 2022-02-18 DIAGNOSIS — L680 Hirsutism: Secondary | ICD-10-CM | POA: Diagnosis not present

## 2022-02-18 DIAGNOSIS — Z3009 Encounter for other general counseling and advice on contraception: Secondary | ICD-10-CM

## 2022-02-18 DIAGNOSIS — E282 Polycystic ovarian syndrome: Secondary | ICD-10-CM

## 2022-02-18 DIAGNOSIS — Z30011 Encounter for initial prescription of contraceptive pills: Secondary | ICD-10-CM | POA: Diagnosis not present

## 2022-02-18 MED ORDER — NORGESTIMATE-ETH ESTRADIOL 0.25-35 MG-MCG PO TABS: 1.0000 | ORAL_TABLET | Freq: Every day | ORAL | 11 refills | Status: AC

## 2022-02-18 NOTE — Progress Notes (Signed)
? ? ?  GYNECOLOGY VIRTUAL VISIT ENCOUNTER NOTE ? ?Provider location: [facility] [place]  ? ?Patient location: Home ? ?I connected with Loretta Schmitt on 02/18/22 at 11:15 AM EDT by MyChart Video Encounter and verified that I am speaking with the correct person using two identifiers. ?  ?I discussed the limitations, risks, security and privacy concerns of performing an evaluation and management service virtually and the availability of in person appointments. I also discussed with the patient that there may be a patient responsible charge related to this service. The patient expressed understanding and agreed to proceed. ?  ?History:  ?Loretta Schmitt is a 31 y.o. [phone] female being evaluated today for increased hair growth on face, chin and chest.  She suspects that she may nave PCOS. Periods are regular. She denies any abnormal vaginal discharge, bleeding, pelvic pain or other concerns.   ?  ?  ?Past Medical History:  ?Diagnosis Date  ? Anemia   ? Asthma   ? Scoliosis   ? ?Past Surgical History:  ?Procedure Laterality Date  ? CESAREAN SECTION    ? CESAREAN SECTION N/A 02/15/2020  ? Procedure: CESAREAN SECTION;  Surgeon: Federico Flake, MD;  Location: [facility] LD ORS;  Service: Obstetrics;  Laterality: N/A;  ? ?The following portions of the patient's history were reviewed and updated as appropriate: allergies, current medications, past family history, past medical history, past social history, past surgical history and problem list.  ? ?Health Maintenance:  Normal pap and negative HRHPV on 8-17 2021.    ? ?Review of Systems:  ?Pertinent items noted in HPI and remainder of comprehensive ROS otherwise negative. ? ?Physical Exam:  ? ?General:  Alert, oriented and cooperative. Patient appears to be in no acute distress.  ?Mental Status: Normal mood and affect. Normal behavior. Normal judgment and thought content.   ?Respiratory: Normal respiratory effort, no problems with respiration noted  ?Rest  of physical exam deferred due to type of encounter ? ?Labs and Imaging ?No results found for this or any previous visit (from the past 336 hour(s)). ?No results found.   ?  ?Assessment and Plan:  ?   ?1. PCOS (polycystic ovarian syndrome) ?- discussed criteria for diagnosis of PCOS and some treatment options ?- all questions answered to patient's satisfaction  ?Rx: ?- US PELVIC COMPLETE WITH TRANSVAGINAL; Future ? ?2. Hirsutism ?- excessive hair growth on face, chin and chest ? ?3. Encounter for counseling regarding contraception ?- discussed options ?- low androgenic OCP's recommended because of PCOS  ? ?4. Encounter for initial prescription of contraceptive pills ?Rx: ?- norgestimate-ethinyl estradiol (ORTHO-CYCLEN) 0.25-35 MG-MCG tablet; Take 1 tablet by mouth daily.  Dispense: 28 tablet; Refill: 11 ? ?    ?I discussed the assessment and treatment plan with the patient. The patient was provided an opportunity to ask questions and all were answered. The patient agreed with the plan and demonstrated an understanding of the instructions. ?  ?The patient was advised to call back or seek an in-person evaluation/go to the ED if the symptoms worsen or if the condition fails to improve as anticipated. ? ?I have spent a total of 15 minutes of non-face-to-face time, excluding clinical staff time, reviewing notes and preparing to see patient, ordering tests and/or medications, and counseling the patient.  ? ? ?Coral Ceo, MD ?[facility], [facility], [facility] ?02/18/22  ?

## 2022-02-18 NOTE — Progress Notes (Signed)
Patient presents for Mychart to discuss PCOS concerns. Patient identified with two patient identifiers. Patient states that she is having increased hair growth. She is having regular cycles. No other concerns. ?

## 2022-02-27 ENCOUNTER — Ambulatory Visit: Admission: RE | Admit: 2022-02-27 | Payer: Medicaid Other | Source: Ambulatory Visit

## 2022-03-21 ENCOUNTER — Other Ambulatory Visit: Payer: Medicaid Other

## 2022-03-27 ENCOUNTER — Ambulatory Visit: Payer: Medicaid Other | Admitting: Obstetrics

## 2022-04-04 DIAGNOSIS — S60423A Blister (nonthermal) of left middle finger, initial encounter: Secondary | ICD-10-CM | POA: Diagnosis not present

## 2022-04-04 DIAGNOSIS — Z5321 Procedure and treatment not carried out due to patient leaving prior to being seen by health care provider: Secondary | ICD-10-CM | POA: Diagnosis not present

## 2022-04-04 DIAGNOSIS — X58XXXA Exposure to other specified factors, initial encounter: Secondary | ICD-10-CM | POA: Insufficient documentation

## 2022-04-05 ENCOUNTER — Emergency Department (HOSPITAL_COMMUNITY)
Admission: EM | Admit: 2022-04-05 | Discharge: 2022-04-05 | Discharge status: Against Medical Advice | Payer: Medicaid Other | Attending: Emergency Medicine | Admitting: Emergency Medicine

## 2022-04-05 ENCOUNTER — Other Ambulatory Visit: Payer: Self-pay

## 2022-04-05 ENCOUNTER — Encounter (HOSPITAL_COMMUNITY): Payer: Self-pay | Admitting: Emergency Medicine

## 2022-04-05 NOTE — ED Triage Notes (Signed)
Pt reported to ED with c/o "blister" to left second finger x2 weeks. Pt also noticed similar blister on palm of left hand.

## 2022-04-23 ENCOUNTER — Encounter (HOSPITAL_COMMUNITY): Payer: Self-pay

## 2022-04-23 ENCOUNTER — Ambulatory Visit (HOSPITAL_COMMUNITY)
Admission: EM | Admit: 2022-04-23 | Discharge: 2022-04-23 | Disposition: A | Discharge status: Home / Self Care | Payer: Medicaid Other | Attending: Family Medicine | Admitting: Family Medicine

## 2022-04-23 DIAGNOSIS — L309 Dermatitis, unspecified: Secondary | ICD-10-CM | POA: Diagnosis not present

## 2022-04-23 DIAGNOSIS — L03011 Cellulitis of right finger: Secondary | ICD-10-CM | POA: Diagnosis not present

## 2022-04-23 MED ORDER — AMOXICILLIN-POT CLAVULANATE 875-125 MG PO TABS: 1.0000 | ORAL_TABLET | Freq: Two times a day (BID) | ORAL | 0 refills | Status: AC

## 2022-04-23 MED ORDER — MUPIROCIN 2 % EX OINT: 1.0000 | TOPICAL_OINTMENT | Freq: Two times a day (BID) | CUTANEOUS | 0 refills | Status: AC

## 2022-04-23 MED ORDER — TRIAMCINOLONE ACETONIDE 40 MG/ML IJ SUSP
INTRAMUSCULAR | Status: AC
  Filled 2022-04-23: qty 1

## 2022-04-23 MED ORDER — TRIAMCINOLONE ACETONIDE 40 MG/ML IJ SUSP
40.0000 mg | Freq: Once | INTRAMUSCULAR | Status: AC
  Administered 2022-04-23: 40 mg via INTRAMUSCULAR

## 2022-04-23 NOTE — ED Triage Notes (Signed)
Pt reports blister on left hand and finger x 1-2 months.

## 2022-04-23 NOTE — ED Provider Notes (Signed)
[place]    CSN: [id number] Arrival date & time: 04/23/22  1410      History   Chief Complaint Chief Complaint  Patient presents with   Finger Injury    HPI Loretta Schmitt is a 31 y.o. female.   HPI Here for rash and redness and pain on her right ring finger and also some rash on her left palm.  This has been going on about a month.  She has had similar rash on her feet.  No fever or vomiting noted.  Last menstrual cycle was about 3 weeks ago.  She works from home on a computer.  Past Medical History:  Diagnosis Date   Anemia    Asthma    Scoliosis     Patient Active Problem List   Diagnosis Date Noted   Vacuum-assisted cesarean delivery, delivered, current hospitalization 02/15/2020   NST (non-stress test) nonreactive 02/14/2020   Diet controlled gestational diabetes mellitus (GDM) in third trimester 12/01/2019   Supervision of high risk pregnancy, antepartum 09/20/2019   H/O: C-section 09/12/2019   History of preterm delivery 09/12/2019   Morning sickness 07/25/2019    Past Surgical History:  Procedure Laterality Date   CESAREAN SECTION     CESAREAN SECTION N/A 02/15/2020   Procedure: CESAREAN SECTION;  Surgeon: Caren Macadam, MD;  Location: [facility] LD ORS;  Service: Obstetrics;  Laterality: N/A;    OB History     Gravida  3   Para  2   Term  1   Preterm  1   AB  1   Living  2      SAB      IAB  1   Ectopic      Multiple  0   Live Births  2            Home Medications    Prior to Admission medications   Medication Sig Start Date End Date Taking? Authorizing Provider  amoxicillin-clavulanate (AUGMENTIN) 875-125 MG tablet Take 1 tablet by mouth 2 (two) times daily for 7 days. 04/23/22 04/30/22 Yes Arslan Kier, Gwenlyn Perking, MD  mupirocin ointment (BACTROBAN) 2 % Apply 1 Application topically 2 (two) times daily. To affected area till better 04/23/22  Yes Oisin Yoakum, Gwenlyn Perking, MD  acetaminophen (TYLENOL) 325 MG tablet Take  2 tablets (650 mg total) by mouth every 6 (six) hours as needed for mild pain (temperature > 101.5.). 02/17/20   Fair, Marin Shutter, MD  albuterol (VENTOLIN HFA) 108 (90 Base) MCG/ACT inhaler Inhale 2 puffs into the lungs every 6 (six) hours as needed for wheezing or shortness of breath. 07/29/19   Laury Deep, CNM  Blood Pressure Monitoring (BLOOD PRESSURE KIT) DEVI 1 kit by Does not apply route as needed. Patient not taking: Reported on 02/18/2022 10/05/19   Lavonia Drafts, MD  ibuprofen (ADVIL) 800 MG tablet Take 1 tablet (800 mg total) by mouth every 8 (eight) hours as needed. 02/17/20   Fair, Marin Shutter, MD  metroNIDAZOLE (FLAGYL) 500 MG tablet Take 1 tablet (500 mg total) by mouth 2 (two) times daily. 05/29/20   Shelly Bombard, MD  naproxen (NAPROSYN) 500 MG tablet Take 1 tablet (500 mg total) by mouth 2 (two) times daily. Patient not taking: Reported on 02/18/2022 05/18/21   Schmitt Heady, PA-C  norgestimate-ethinyl estradiol (ORTHO-CYCLEN) 0.25-35 MG-MCG tablet Take 1 tablet by mouth daily. 02/18/22   Shelly Bombard, MD    Family History No family history on file.  Social History  Social History   Tobacco Use   Smoking status: Former    Types: Cigarettes    Quit date: 10/06/2012    Years since quitting: 9.5   Smokeless tobacco: Never  Vaping Use   Vaping Use: Never used  Substance Use Topics   Alcohol use: No   Drug use: Yes    Types: Marijuana     Allergies   Patient has no known allergies.   Review of Systems Review of Systems   Physical Exam Triage Vital Signs ED Triage Vitals [04/23/22 1430]  Enc Vitals Group     BP 135/73     Pulse Rate 82     Resp 18     Temp 98.3 F (36.8 C)     Temp Source Oral     SpO2 100 %     Weight      Height      Head Circumference      Peak Flow      Pain Score      Pain Loc      Pain Edu?      Excl. in [place]?    No data found.  Updated Vital Signs BP 135/73 (BP Location: Left Arm)   Pulse 82   Temp 98.3 F  (36.8 C) (Oral)   Resp 18   SpO2 100%   Visual Acuity Right Eye Distance:   Left Eye Distance:   Bilateral Distance:    Right Eye Near:   Left Eye Near:    Bilateral Near:     Physical Exam Vitals reviewed.  Constitutional:      General: She is not in acute distress.    Appearance: She is not ill-appearing, toxic-appearing or diaphoretic.  HENT:     Mouth/Throat:     Mouth: Mucous membranes are moist.  Eyes:     Extraocular Movements: Extraocular movements intact.     Pupils: Pupils are equal, round, and reactive to light.  Cardiovascular:     Rate and Rhythm: Normal rate and regular rhythm.  Skin:    Coloration: Skin is not pale.     Comments: There is an area about 2 and half centimeters in diameter on her left palm that is scaly and looks like it is unroofed vesicles.  There is no drainage from that area.  There is no erythema or other induration.  On the right ring finger there is erythema on the dorsal side and a little bit on the palmar side of the finger.  It is little swollen and it is tender.  There is no fluctuance in the finger pad.  There is no drainage from the nail folds  Neurological:     Mental Status: She is alert and oriented to person, place, and time.  Psychiatric:        Behavior: Behavior normal.      UC Treatments / Results  Labs (all labs ordered are listed, but only abnormal results are displayed) Labs Reviewed - No data to display  EKG   Radiology No results found.  Procedures Procedures (including critical care time)  Medications Ordered in UC Medications  triamcinolone acetonide (KENALOG-40) injection 40 mg (has no administration in time range)    Initial Impression / Assessment and Plan / UC Course  I have reviewed the triage vital signs and the nursing notes.  Pertinent labs & imaging results that were available during my care of the patient were reviewed by me and considered in my medical decision making (  see chart for  details).     I suspect she has some eczema or some sort of contact/irritant dermatitis that is gone this started.  I will treat for cellulitis and secondary infection.  And we will give her also a shot of steroid.  She is given contact information for dermatology Final Clinical Impressions(s) / UC Diagnoses   Final diagnoses:  Cellulitis of finger of right hand  Eczema, unspecified type     Discharge Instructions      You have been given a shot of triamcinolone 40 mg.  Take amoxicillin-clavulanate 875 mg--1 tab twice daily with food for 7 days  Put mupirocin ointment on the sore areas twice daily until improved     ED Prescriptions     Medication Sig Dispense Auth. Provider   amoxicillin-clavulanate (AUGMENTIN) 875-125 MG tablet Take 1 tablet by mouth 2 (two) times daily for 7 days. 14 tablet Hafiz Irion, Gwenlyn Perking, MD   mupirocin ointment (BACTROBAN) 2 % Apply 1 Application topically 2 (two) times daily. To affected area till better 22 g Windy Carina Gwenlyn Perking, MD      PDMP not reviewed this encounter.   Barrett Henle, MD 04/23/22 [phone]

## 2022-04-23 NOTE — Discharge Instructions (Signed)
You have been given a shot of triamcinolone 40 mg.  Take amoxicillin-clavulanate 875 mg--1 tab twice daily with food for 7 days  Put mupirocin ointment on the sore areas twice daily until improved

## 2022-05-14 ENCOUNTER — Ambulatory Visit: Payer: Medicaid Other | Admitting: Obstetrics and Gynecology

## 2022-06-21 DIAGNOSIS — R112 Nausea with vomiting, unspecified: Secondary | ICD-10-CM | POA: Diagnosis not present

## 2022-06-21 DIAGNOSIS — R109 Unspecified abdominal pain: Secondary | ICD-10-CM | POA: Diagnosis not present

## 2022-06-21 DIAGNOSIS — K5900 Constipation, unspecified: Secondary | ICD-10-CM | POA: Diagnosis not present

## 2022-11-05 ENCOUNTER — Telehealth: Payer: Self-pay | Admitting: *Deleted

## 2022-11-05 NOTE — Telephone Encounter (Signed)
Call received from [facility] for Medicaid). She stated that she is following up on a "Retro" prior auth which needs to be completed for [facility] given to pt in December 2020. Prior Josem Kaufmann is not being requested but the form needs to be completed. She stated that pt saw Dr. Ihor Dow during the visit. She also requests the chart notes. She stated that a fax was sent on 08/15/22 but form was not sent back. She would like to verify the correct fax number to send the form for completion. She can be reached @ [phone] ext [phone].

## 2022-11-07 NOTE — Telephone Encounter (Signed)
Called Erica. No answer. Left message on vm for patient to return call to the office
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
## 2024-03-30 ENCOUNTER — Ambulatory Visit: Admitting: Family Medicine

## 2024-04-29 ENCOUNTER — Encounter (HOSPITAL_COMMUNITY): Payer: Self-pay | Admitting: *Deleted

## 2024-04-29 ENCOUNTER — Emergency Department (HOSPITAL_COMMUNITY)
Admission: EM | Admit: 2024-04-29 | Discharge: 2024-04-29 | Discharge status: Against Medical Advice | Payer: Self-pay | Attending: Emergency Medicine | Admitting: Emergency Medicine

## 2024-04-29 ENCOUNTER — Other Ambulatory Visit: Payer: Self-pay

## 2024-04-29 DIAGNOSIS — Z5321 Procedure and treatment not carried out due to patient leaving prior to being seen by health care provider: Secondary | ICD-10-CM | POA: Insufficient documentation

## 2024-04-29 DIAGNOSIS — S0181XA Laceration without foreign body of other part of head, initial encounter: Secondary | ICD-10-CM | POA: Insufficient documentation

## 2024-04-29 DIAGNOSIS — Z23 Encounter for immunization: Secondary | ICD-10-CM | POA: Insufficient documentation

## 2024-04-29 MED ORDER — TETANUS-DIPHTH-ACELL PERTUSSIS 5-2.5-18.5 LF-MCG/0.5 IM SUSY
0.5000 mL | PREFILLED_SYRINGE | Freq: Once | INTRAMUSCULAR | Status: AC
  Administered 2024-04-29: 0.5 mL via INTRAMUSCULAR

## 2024-04-29 MED ORDER — TETANUS-DIPHTH-ACELL PERTUSSIS 5-2.5-18.5 LF-MCG/0.5 IM SUSY
PREFILLED_SYRINGE | INTRAMUSCULAR | Status: AC
  Filled 2024-04-29: qty 0.5
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
## 2024-04-29 NOTE — ED Notes (Signed)
 Pt set patient labels on counter, and walked out of the ED with her children, refusing to communicate with this RN on approach. Ambulatory off unit
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
## 2024-04-30 ENCOUNTER — Other Ambulatory Visit: Payer: Self-pay

## 2024-04-30 ENCOUNTER — Encounter (HOSPITAL_COMMUNITY): Payer: Self-pay

## 2024-04-30 ENCOUNTER — Emergency Department (HOSPITAL_COMMUNITY): Payer: Self-pay

## 2024-04-30 ENCOUNTER — Emergency Department (HOSPITAL_COMMUNITY)
Admission: EM | Admit: 2024-04-30 | Discharge: 2024-04-30 | Disposition: A | Discharge status: Home / Self Care | Payer: Self-pay | Attending: Emergency Medicine | Admitting: Emergency Medicine

## 2024-04-30 DIAGNOSIS — S01511A Laceration without foreign body of lip, initial encounter: Secondary | ICD-10-CM | POA: Insufficient documentation

## 2024-04-30 DIAGNOSIS — S022XXA Fracture of nasal bones, initial encounter for closed fracture: Secondary | ICD-10-CM | POA: Insufficient documentation

## 2024-04-30 DIAGNOSIS — W0110XA Fall on same level from slipping, tripping and stumbling with subsequent striking against unspecified object, initial encounter: Secondary | ICD-10-CM | POA: Insufficient documentation

## 2024-04-30 MED ORDER — TETANUS-DIPHTH-ACELL PERTUSSIS 5-2.5-18.5 LF-MCG/0.5 IM SUSY
0.5000 mL | PREFILLED_SYRINGE | Freq: Once | INTRAMUSCULAR | Status: DC
  Filled 2024-04-30: qty 0.5

## 2024-04-30 MED ORDER — LIDOCAINE-EPINEPHRINE-TETRACAINE (LET) TOPICAL GEL
3.0000 mL | Freq: Once | TOPICAL | Status: AC
  Administered 2024-04-30: 3 mL via TOPICAL
  Filled 2024-04-30: qty 3

## 2024-04-30 MED ORDER — IBUPROFEN 400 MG PO TABS
400.0000 mg | ORAL_TABLET | Freq: Once | ORAL | Status: AC
  Administered 2024-04-30: 400 mg via ORAL
  Filled 2024-04-30: qty 1

## 2024-04-30 MED ORDER — CEPHALEXIN 500 MG PO CAPS: 500.0000 mg | ORAL_CAPSULE | Freq: Four times a day (QID) | ORAL | 0 refills | Status: AC

## 2024-04-30 MED ORDER — CEPHALEXIN 250 MG PO CAPS
500.0000 mg | ORAL_CAPSULE | Freq: Once | ORAL | Status: AC
  Administered 2024-04-30: 500 mg via ORAL
  Filled 2024-04-30: qty 2

## 2024-04-30 MED ORDER — ACETAMINOPHEN 500 MG PO TABS
1000.0000 mg | ORAL_TABLET | Freq: Once | ORAL | Status: AC
  Administered 2024-04-30: 1000 mg via ORAL
  Filled 2024-04-30: qty 2

## 2024-04-30 NOTE — ED Provider Notes (Signed)
 [place] EMERGENCY DEPARTMENT AT [facility] Provider Note   CSN: [id number] Arrival date & time: 04/30/24  1038     Patient presents with: Lip Laceration and Joint Swelling   Loretta Schmitt is a 33 y.o. female.   This is a 33 year old female here today after she had a slip and fall yesterday.  Patient reports that she lost her balance, fell down and struck her face.  She did not lose consciousness.  She is not on blood thinners.  Patient came to the emergency room, but left because the wait was too long.  She is also endorsing intermittent swelling in her ankles that has been ongoing for several weeks.       Prior to Admission medications   Medication Sig Start Date End Date Taking? Authorizing Provider  cephALEXin  (KEFLEX ) 500 MG capsule Take 1 capsule (500 mg total) by mouth 4 (four) times daily for 5 days. 04/30/24 05/05/24 Yes Mannie Pac T, DO  acetaminophen  (TYLENOL ) 325 MG tablet Take 2 tablets (650 mg total) by mouth every 6 (six) hours as needed for mild pain (temperature > 101.5.). 02/17/20   Fair, Mitzie SAILOR, MD  albuterol  (VENTOLIN  HFA) 108 (90 Base) MCG/ACT inhaler Inhale 2 puffs into the lungs every 6 (six) hours as needed for wheezing or shortness of breath. 07/29/19   Dawson, Rolitta, CNM  Blood Pressure Monitoring (BLOOD PRESSURE KIT) DEVI 1 kit by Does not apply route as needed. Patient not taking: Reported on 02/18/2022 10/05/19   Corene Coy, MD  ibuprofen  (ADVIL ) 800 MG tablet Take 1 tablet (800 mg total) by mouth every 8 (eight) hours as needed. 02/17/20   Fair, Mitzie SAILOR, MD  metroNIDAZOLE  (FLAGYL ) 500 MG tablet Take 1 tablet (500 mg total) by mouth 2 (two) times daily. 05/29/20   Rudy Carlin LABOR, MD  mupirocin  ointment (BACTROBAN ) 2 % Apply 1 Application topically 2 (two) times daily. To affected area till better 04/23/22   Vonna Sharlet POUR, MD  naproxen  (NAPROSYN ) 500 MG tablet Take 1 tablet (500 mg total) by mouth 2 (two) times  daily. Patient not taking: Reported on 02/18/2022 05/18/21   Khatri, Hina, PA-C  norgestimate -ethinyl estradiol  (ORTHO-CYCLEN) 0.25-35 MG-MCG tablet Take 1 tablet by mouth daily. 02/18/22   Rudy Carlin LABOR, MD    Allergies: Patient has no known allergies.    Review of Systems  Updated Vital Signs BP 122/78   Pulse 82   Temp 98.4 F (36.9 C) (Oral)   Resp 12   LMP 03/31/2024   SpO2 98%   Physical Exam Vitals and nursing note reviewed.  HENT:     Nose: Nose normal.     Comments: No septal hematoma    Mouth/Throat:     Comments: In the left upper lip there is a small laceration through the vermilion border.  No granulation tissue present yet.  No chipped or missing teeth. Eyes:     Pupils: Pupils are equal, round, and reactive to light.  Cardiovascular:     Rate and Rhythm: Normal rate.  Pulmonary:     Effort: Pulmonary effort is normal.  Musculoskeletal:        General: No swelling. Normal range of motion.  Skin:    General: Skin is warm and dry.  Neurological:     Mental Status: She is alert.     (all labs ordered are listed, but only abnormal results are displayed) Labs Reviewed - No data to display  EKG: None  Radiology: CT Maxillofacial  Wo Contrast Result Date: 04/30/2024 CLINICAL DATA:  Facial trauma, blunt. Upper lip pain after a fall yesterday. EXAM: CT MAXILLOFACIAL WITHOUT CONTRAST TECHNIQUE: Multidetector CT imaging of the maxillofacial structures was performed. Multiplanar CT image reconstructions were also generated. RADIATION DOSE REDUCTION: This exam was performed according to the departmental dose-optimization program which includes automated exposure control, adjustment of the mA and/or kV according to patient size and/or use of iterative reconstruction technique. COMPARISON:  None Available. FINDINGS: Osseous: Minimally displaced right-sided nasal bone fracture with mild overlying soft tissue swelling. No other acute fracture, mandibular dislocation, or  destructive process. Dental caries. Periapical lucencies involving bilateral maxillary premolar teeth. Orbits: Mild right periorbital soft tissue swelling laterally and superiorly. No retrobulbar hematoma. Sinuses: Mild-to-moderate mucosal thickening in the right maxillary sinus. Clear mastoid air cells and middle ear cavities. Soft tissues: No other acute finding. Limited intracranial: Unremarkable. IMPRESSION: Minimally displaced right nasal bone fracture. Electronically Signed   By: Dasie Hamburg M.D.   On: 04/30/2024 12:15     .Laceration Repair  Date/Time: 04/30/2024 1:19 PM  Performed by: Mannie Fairy DASEN, DO Authorized by: Mannie Fairy DASEN, DO   Consent:    Consent obtained:  Verbal   Risks discussed:  Infection, pain, poor cosmetic result, need for additional repair and poor wound healing Universal protocol:    Patient identity confirmed:  Verbally with patient Laceration details:    Location:  Lip   Lip location:  Upper exterior lip   Length (cm):  2.8 Repair type:    Repair type:  Complex Comments:     2.8 cm linear laceration through the vermilion border.  Anesthetized using let gel.  Irrigated with 30 cc of sterile saline.  Using 5-0 Vicryl repeat, placed initial suture through the vermilion border, connecting to opposing side with good wound approximation.  Placed 2 additional simple interrupted sutures inferior to the vermilion border.  Most inferior aspect of wound left open as it was inside the mouth, should heal fine on its own.  Loosely close given time open and concern for infection.    Medications Ordered in the ED  Tdap (BOOSTRIX ) injection 0.5 mL (0.5 mLs Intramuscular Patient Refused/Not Given 04/30/24 1140)  cephALEXin  (KEFLEX ) capsule 500 mg (has no administration in time range)  lidocaine -EPINEPHrine -tetracaine  (LET) topical gel (3 mLs Topical Given 04/30/24 1140)  acetaminophen  (TYLENOL ) tablet 1,000 mg (1,000 mg Oral Given 04/30/24 1140)  ibuprofen  (ADVIL )  tablet 400 mg (400 mg Oral Given 04/30/24 1140)                                    Medical Decision Making 33 year old female here today for a lip laceration and facial pain.  Differential diagnosis includes lip laceration, facial contusion, consider facial fracture.  Plan-given the location of the patient's wound, will attempt to close despite how long it has been since her laceration occurred.  Will close through the vermilion border, likely provide the patient with some antibiotics and allow for secondary healing.  Will obtain CT max face of the patient as she has tenderness over both of her cheeks.  No missing or chipped teeth.  Reassessment 1:20 PM-laceration repaired.  Loosely close given concern for possibility of infection developing given time of wound being open.  However, with the location of believe cosmetic repair was required.  Her CT max face showed minimally displaced Nasal bone fracture.  Will have her follow-up with her PCP  regarding this.  Discharged with Keflex .  Regarding the patient's intermittent lower extremity swelling.  Seems like lymphedema.  No evidence of cellulitis, DVT, or history of heart failure.  Amount and/or Complexity of Data Reviewed Radiology: ordered.  Risk OTC drugs. Prescription drug management.        Final diagnoses:  Lip laceration, initial encounter  Closed fracture of nasal bone, initial encounter    ED Discharge Orders          Ordered    cephALEXin  (KEFLEX ) 500 MG capsule  4 times daily        04/30/24 1323               Mannie Pac T, DO 04/30/24 1323

## 2024-04-30 NOTE — Discharge Instructions (Addendum)
 You have a small fracture of your nose.  This should heal just fine on its own.  Follow-up with your PCP to discuss this further.  You had a laceration in your lip repaired.  The sutures are dissolvable, and should come out within 5 to 7 days.  I sent a prescription for Keflex .  You may take this 4 times per day for the next 5 days.  Return to the emergency room if you notice increased swelling, or pus coming from the area.  Follow-up with your primary care doctor.  If you do not have a primary care doctor, I have included the information for the [facility] wellness clinic.
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
